# Patient Record
Sex: Female | Born: 1935 | Race: Black or African American | Hispanic: No | Smoking: Current every day smoker
Health system: Southern US, Community
[De-identification: ages and names within clinical notes are randomized; demographics above are authoritative.]

## PROBLEM LIST (undated history)

## (undated) DIAGNOSIS — F329 Major depressive disorder, single episode, unspecified: Secondary | ICD-10-CM

## (undated) DIAGNOSIS — N189 Chronic kidney disease, unspecified: Secondary | ICD-10-CM

## (undated) DIAGNOSIS — J45909 Unspecified asthma, uncomplicated: Secondary | ICD-10-CM

## (undated) DIAGNOSIS — I739 Peripheral vascular disease, unspecified: Secondary | ICD-10-CM

## (undated) DIAGNOSIS — I10 Essential (primary) hypertension: Secondary | ICD-10-CM

## (undated) DIAGNOSIS — F32A Depression, unspecified: Secondary | ICD-10-CM

## (undated) DIAGNOSIS — M1990 Unspecified osteoarthritis, unspecified site: Secondary | ICD-10-CM

## (undated) HISTORY — DX: Essential (primary) hypertension: I10

## (undated) HISTORY — DX: Depression, unspecified: F32.A

## (undated) HISTORY — PX: ABDOMINAL HYSTERECTOMY: SHX81

## (undated) HISTORY — PX: PARATHYROIDECTOMY: SHX19

## (undated) HISTORY — PX: THYROID SURGERY: SHX805

## (undated) HISTORY — DX: Major depressive disorder, single episode, unspecified: F32.9

## (undated) HISTORY — DX: Unspecified osteoarthritis, unspecified site: M19.90

---

## 2004-08-22 ENCOUNTER — Ambulatory Visit: Payer: Self-pay | Admitting: Unknown Physician Specialty

## 2004-09-20 ENCOUNTER — Ambulatory Visit: Payer: Self-pay | Admitting: Unknown Physician Specialty

## 2004-12-14 ENCOUNTER — Ambulatory Visit: Payer: Self-pay | Admitting: General Surgery

## 2005-02-14 ENCOUNTER — Ambulatory Visit (HOSPITAL_COMMUNITY): Admission: RE | Admit: 2005-02-14 | Discharge: 2005-02-14 | Payer: Self-pay | Admitting: Neurological Surgery

## 2005-04-16 ENCOUNTER — Emergency Department: Payer: Self-pay | Admitting: Unknown Physician Specialty

## 2005-04-19 ENCOUNTER — Ambulatory Visit: Payer: Self-pay | Admitting: Unknown Physician Specialty

## 2005-05-09 ENCOUNTER — Ambulatory Visit: Payer: Self-pay | Admitting: Vascular Surgery

## 2006-06-19 ENCOUNTER — Emergency Department: Payer: Self-pay | Admitting: Emergency Medicine

## 2007-10-01 ENCOUNTER — Emergency Department: Payer: Self-pay | Admitting: Emergency Medicine

## 2007-11-21 ENCOUNTER — Ambulatory Visit: Payer: Self-pay | Admitting: Specialist

## 2010-06-03 ENCOUNTER — Ambulatory Visit: Payer: Self-pay | Admitting: Specialist

## 2011-01-17 ENCOUNTER — Ambulatory Visit: Payer: Self-pay | Admitting: Specialist

## 2013-08-19 ENCOUNTER — Ambulatory Visit: Payer: Self-pay | Admitting: Unknown Physician Specialty

## 2014-07-07 ENCOUNTER — Ambulatory Visit: Payer: Self-pay | Admitting: Neurosurgery

## 2014-12-10 ENCOUNTER — Encounter: Payer: Self-pay | Admitting: Podiatry

## 2014-12-10 ENCOUNTER — Ambulatory Visit (INDEPENDENT_AMBULATORY_CARE_PROVIDER_SITE_OTHER): Payer: Medicare Other | Admitting: Podiatry

## 2014-12-10 ENCOUNTER — Ambulatory Visit (INDEPENDENT_AMBULATORY_CARE_PROVIDER_SITE_OTHER): Payer: Medicare Other

## 2014-12-10 VITALS — BP 148/77 | HR 69 | Resp 12

## 2014-12-10 DIAGNOSIS — R52 Pain, unspecified: Secondary | ICD-10-CM

## 2014-12-10 DIAGNOSIS — L97521 Non-pressure chronic ulcer of other part of left foot limited to breakdown of skin: Secondary | ICD-10-CM

## 2014-12-10 DIAGNOSIS — T560X1A Toxic effect of lead and its compounds, accidental (unintentional), initial encounter: Secondary | ICD-10-CM

## 2014-12-10 DIAGNOSIS — M10172 Lead-induced gout, left ankle and foot: Secondary | ICD-10-CM

## 2014-12-10 DIAGNOSIS — R609 Edema, unspecified: Secondary | ICD-10-CM

## 2014-12-10 NOTE — Progress Notes (Signed)
   Subjective:    Patient ID: Nancy Larsen, female    DOB: 1934/08/25, 81 y.o.   MRN: [id number]  HPI  81 year old female presents the office with complaints of left foot pain which has been ongoing for approximately 2 weeks. She states that she believes that she has gout her left foot. She does that she has a history of gout her right foot and this feels like it is the same pain. She states that particular she has pain to her left fourth toe. She denies any history of injury or trauma. She states that she has tenderness and swelling to the left foot as well as pain. He denies any redness overlying the area or any increase in warmth. No recent treatment. She does that she has a history of vascular disease and she sees a Hydrographic surveyor at [facility]. She states that she has not undergone intervention and she has not wanted to undergo the treatment. She has a states that her left calf is been swelling and she periodically has pain. No other complaints at this time.   Review of Systems  All other systems reviewed and are negative.      Objective:   Physical Exam AAO 3, NAD DP/PT pulses palpable decreased bilaterally. CRT less than 3 seconds Protective sensation decreased with Simms Weinstein monofilament, decreased vibratory sensation, Achilles tendon reflex intact. There is mild edema overlying the left forefoot compared to the contralateral extremity. In particular the left fourth digit is edematous and there is evidence of epidermolysis along the dorsal aspect of the digit. Upon debridement dorsal acid of the digit of the fourth toe there is a small superficial granular wound measuring 0.3 x 0.3 cm. There is no surrounding erythema, ascending cellulitis, increase in warmth, fluctuance, crepitus, drainage/purulence, malodor. There is no probing, undermining, tunneling. There is tenderness overlying this wound. There is also tenderness overlying the medial aspect of the first metatarsal head and  there is a moderate bunion deformity present. There is no overlying edema, erythema, increase in warmth. There is no other areas of tenderness to bilateral lower extremities. There is a scar from prior surgery applying the fifth metatarsal. The fifth metatarsal is sitting in a dorsal medial position overlapping the fourth. No other open lesions or pre-ulcerative lesions identified bilaterally. There is mild tenderness with compression of the left calf. There is no significant swelling compared to the contralateral extremity and there is no erythema or increase in warmth.    Assessment & Plan:  81 year old female with left foot pain, gout versus infection; left calf swelling -X-rays were obtained and reviewed -Treatment options discussed with the patient clear alternatives, risks, complications -At this time discussed various etiology of her symptoms. Due to the discomfort patient was placed into a surgical shoe to help offload the forefoot. Antibiotic ointment and a Band-Aid was applied overlying the wound to the left fourth toe. Recommended patient to continue with daily dressing changes. Monitor for any signs or symptoms of infection and directed to call the office and medially should any occur or go to the ER. -Ordered CBC, ESR, CRP, uric acid. We'll start medication after results of the blood work. At this time there is no overt signs of infection other than swelling. -Ordered venous duplex to rule out DVT.  -Follow-up with 2 weeks or sooner if any problems are to arise. In the meantime encouraged to call the office with any questions, concerns, change in symptoms.

## 2014-12-10 NOTE — Patient Instructions (Signed)
Continue antibiotic ointment and a band-aid to the right 4th toe daily. Monitor for any signs/symptoms of infection. Call the office immediately if any occur or go directly to the emergency room. Call with any questions/concerns.

## 2014-12-11 ENCOUNTER — Other Ambulatory Visit: Payer: Self-pay | Admitting: Podiatry

## 2014-12-11 ENCOUNTER — Telehealth: Payer: Self-pay

## 2014-12-11 LAB — CBC WITH DIFFERENTIAL/PLATELET
BASOS ABS: 0 10*3/uL (ref 0.0–0.2)
BASOS: 0 %
EOS: 3 %
Eosinophils Absolute: 0.1 10*3/uL (ref 0.0–0.4)
HCT: 34.7 % (ref 34.0–46.6)
HEMOGLOBIN: 11 g/dL — AB (ref 11.1–15.9)
Immature Grans (Abs): 0 10*3/uL (ref 0.0–0.1)
Immature Granulocytes: 0 %
LYMPHS: 28 %
Lymphocytes Absolute: 1.3 10*3/uL (ref 0.7–3.1)
MCH: 30.4 pg (ref 26.6–33.0)
MCHC: 31.7 g/dL (ref 31.5–35.7)
MCV: 96 fL (ref 79–97)
MONOCYTES: 9 %
Monocytes Absolute: 0.4 10*3/uL (ref 0.1–0.9)
NEUTROS ABS: 2.8 10*3/uL (ref 1.4–7.0)
Neutrophils Relative %: 60 %
PLATELETS: 186 10*3/uL (ref 150–379)
RBC: 3.62 x10E6/uL — AB (ref 3.77–5.28)
RDW: 13 % (ref 12.3–15.4)
WBC: 4.7 10*3/uL (ref 3.4–10.8)

## 2014-12-11 LAB — SEDIMENTATION RATE: SED RATE: 32 mm/h (ref 0–40)

## 2014-12-11 LAB — C-REACTIVE PROTEIN: CRP: 16.5 mg/L — ABNORMAL HIGH (ref 0.0–4.9)

## 2014-12-11 LAB — URIC ACID: Uric Acid: 8.7 mg/dL — ABNORMAL HIGH (ref 2.5–7.1)

## 2014-12-11 MED ORDER — COLCHICINE 0.6 MG PO TABS: 0.6000 mg | ORAL_TABLET | Freq: Every day | ORAL | Status: DC

## 2014-12-11 NOTE — Addendum Note (Signed)
Addended by: Shelly BombardPOLK, Makena Murdock M on: 12/11/2014 08:17 AM   Modules accepted: Orders

## 2014-12-11 NOTE — Addendum Note (Signed)
Addended by: Shelly BombardPOLK, Nahima Ales M on: 12/11/2014 04:50 PM   Modules accepted: Orders

## 2014-12-12 NOTE — Telephone Encounter (Signed)
I called the patient in regards to her blood work. The blood work did reveal an elevated uric acid. Because of this I have sent colchicine to the patients pharmacy and a called to discuss this with her. I discussed side effects the medication and she has any issues to call the office. She states that she has doing better and the pain at night has improved. Also she has requested to have her venous duplex [facility]. Order had been canceled for [facility] and the staff called [facility] yesterday. [facility] should call the patient to schedule the study. Discussed that if she has not heard from them on Monday to call the office Monday afternoon. In the meantime encouraged to call the office with any questions, concerns, change in symptoms.

## 2014-12-15 ENCOUNTER — Telehealth (HOSPITAL_COMMUNITY): Payer: Self-pay | Admitting: *Deleted

## 2014-12-22 ENCOUNTER — Other Ambulatory Visit: Payer: Self-pay | Admitting: *Deleted

## 2014-12-22 ENCOUNTER — Ambulatory Visit (HOSPITAL_COMMUNITY)
Admission: RE | Admit: 2014-12-22 | Discharge: 2014-12-22 | Disposition: A | Discharge status: Home / Self Care | Payer: Medicare Other | Source: Ambulatory Visit | Attending: Podiatry | Admitting: Podiatry

## 2014-12-22 DIAGNOSIS — M10172 Lead-induced gout, left ankle and foot: Secondary | ICD-10-CM | POA: Insufficient documentation

## 2014-12-22 DIAGNOSIS — R609 Edema, unspecified: Secondary | ICD-10-CM

## 2014-12-22 DIAGNOSIS — T560X1A Toxic effect of lead and its compounds, accidental (unintentional), initial encounter: Secondary | ICD-10-CM | POA: Diagnosis not present

## 2014-12-22 DIAGNOSIS — R52 Pain, unspecified: Secondary | ICD-10-CM

## 2014-12-22 DIAGNOSIS — M79672 Pain in left foot: Secondary | ICD-10-CM | POA: Insufficient documentation

## 2014-12-22 DIAGNOSIS — L97521 Non-pressure chronic ulcer of other part of left foot limited to breakdown of skin: Secondary | ICD-10-CM | POA: Diagnosis not present

## 2014-12-22 NOTE — Progress Notes (Signed)
Lower Extremity Venous Duplex Completed. No evidence for DVT, SVT, or venous insufficiency. Suggest possible lower extremity arterial study based on patients complaints of leg cramping. Brianna L Mazza,RVT

## 2014-12-24 ENCOUNTER — Ambulatory Visit (INDEPENDENT_AMBULATORY_CARE_PROVIDER_SITE_OTHER): Payer: Medicare Other | Admitting: Podiatry

## 2014-12-24 DIAGNOSIS — R0989 Other specified symptoms and signs involving the circulatory and respiratory systems: Secondary | ICD-10-CM | POA: Diagnosis not present

## 2014-12-24 DIAGNOSIS — R252 Cramp and spasm: Secondary | ICD-10-CM | POA: Diagnosis not present

## 2014-12-24 DIAGNOSIS — M10072 Idiopathic gout, left ankle and foot: Secondary | ICD-10-CM

## 2014-12-24 DIAGNOSIS — M109 Gout, unspecified: Secondary | ICD-10-CM

## 2014-12-27 NOTE — Progress Notes (Signed)
Patient ID: Nancy Larsen, female   DOB: [birth year], 81 y.o.   MRN: [id number]  Subjective: 81 year old female presents the office they for follow-up evaluation of left foot pain, gout. Since last appointment she was started on colchicine due to increase in uric acid. She states that since starting the medication she's had reduction in pain and swelling. She does continue has some mild intermittent pain however she does is significantly improved. She also states that the wound on the left fourth toe is healed. She denies any increase in redness overlying the foot. Denies any systemic complaints such as fevers, chills, nausea, vomiting. She does continue to get cramping in her calf. No other complaints at this time.  Objective: AAO 3, NAD DP/PT pulses palpable although decreased, CRT less than 3 seconds Protective sensation decreased with Simms Weinstein monofilament, decreased vibratory sensation, Achilles tendon reflex intact. Currently there are no open lesion identified on the left fourth toe or to other areas of bilateral lower extremities. There is mild edema to the left foot however it does appear to be significantly improved compared to last appointment. There is no specific area pinpoint bony tenderness or pain the vibratory sensation to bilateral lower extremities. There is no overlying erythema or increase in warmth bilaterally. No pain with calf compression, swelling, warmth, erythema.  Assessment: 81 year old female with resolving gout left foot, cramping  Plan: -Treatment options were discussed with the patient including alternatives, risks, complications. -Venous duplex was negative for DVT however they've recommended arterial studies to the cramping. Arterial Dopplers ordered. -Finish 2 week course of colchicine. She should finish that in 2 days. After that recommended to hold off on colchicine and less symptoms persist then continue medication until symptoms resolve. If symptoms  are not resolving within 5-7 days to call the office for follow-up appointment. -Follow-up after arterial studies are performed or sooner if any problems are to arise. In the meantime encouraged to call the office with any questions, concerns, change in symptoms.

## 2014-12-30 ENCOUNTER — Telehealth (HOSPITAL_COMMUNITY): Payer: Self-pay | Admitting: *Deleted

## 2015-01-05 ENCOUNTER — Telehealth (HOSPITAL_COMMUNITY): Payer: Self-pay | Admitting: *Deleted

## 2015-01-11 ENCOUNTER — Telehealth (HOSPITAL_COMMUNITY): Payer: Self-pay | Admitting: *Deleted

## 2015-01-14 ENCOUNTER — Telehealth: Payer: Self-pay | Admitting: *Deleted

## 2015-01-14 NOTE — Telephone Encounter (Signed)
I attempted to call patient as well as children in regards to scheduling appointment for doppler study.  Patient did not answer nor was there a voicemail.  Other phone numbers were not in service.  [facility] has made several attempts to contact patient and schedule an appointment without success.

## 2015-01-14 NOTE — Telephone Encounter (Signed)
-----   Message from Vivi BarrackMatthew R Wagoner, DPM sent at 12/27/2014  5:45 PM EDT ----- I ordered arterial studies for this patient and I put the order in Epic. It [facility] but Augusto GambleJody said she didn't know how to do it as the patient wanted to go to Dr. Hazle CocaBerry's office to have it done. If you could please follow up on it, I would appreciate it. Thanks!

## 2015-03-05 ENCOUNTER — Other Ambulatory Visit: Payer: Self-pay | Admitting: Family Medicine

## 2015-03-05 DIAGNOSIS — Z780 Asymptomatic menopausal state: Secondary | ICD-10-CM

## 2015-03-31 ENCOUNTER — Other Ambulatory Visit: Payer: Self-pay | Admitting: Unknown Physician Specialty

## 2015-03-31 DIAGNOSIS — M25552 Pain in left hip: Secondary | ICD-10-CM

## 2015-04-08 ENCOUNTER — Ambulatory Visit
Admission: RE | Admit: 2015-04-08 | Discharge: 2015-04-08 | Disposition: A | Discharge status: Home / Self Care | Payer: Medicare Other | Source: Ambulatory Visit | Attending: Unknown Physician Specialty | Admitting: Unknown Physician Specialty

## 2015-04-08 DIAGNOSIS — S76012A Strain of muscle, fascia and tendon of left hip, initial encounter: Secondary | ICD-10-CM | POA: Insufficient documentation

## 2015-04-08 DIAGNOSIS — M25552 Pain in left hip: Secondary | ICD-10-CM | POA: Insufficient documentation

## 2015-04-08 DIAGNOSIS — X58XXXA Exposure to other specified factors, initial encounter: Secondary | ICD-10-CM | POA: Diagnosis not present

## 2015-06-16 ENCOUNTER — Encounter: Payer: Self-pay | Admitting: Physical Therapy

## 2015-06-16 ENCOUNTER — Other Ambulatory Visit: Payer: Self-pay | Admitting: Neurology

## 2015-06-16 ENCOUNTER — Ambulatory Visit: Payer: Medicare Other | Attending: Neurology | Admitting: Physical Therapy

## 2015-06-16 DIAGNOSIS — R29898 Other symptoms and signs involving the musculoskeletal system: Secondary | ICD-10-CM | POA: Diagnosis present

## 2015-06-16 DIAGNOSIS — R42 Dizziness and giddiness: Secondary | ICD-10-CM

## 2015-06-16 DIAGNOSIS — R269 Unspecified abnormalities of gait and mobility: Secondary | ICD-10-CM | POA: Diagnosis present

## 2015-06-17 DIAGNOSIS — R42 Dizziness and giddiness: Secondary | ICD-10-CM | POA: Diagnosis not present

## 2015-06-17 NOTE — Therapy (Signed)
[facility] MAIN [facility] SERVICES [address] [place], [place], [address] Phone: [phone]   Fax:  [phone]  Physical Therapy Evaluation  Patient Details  Name: LALIA LOUDON MRN: [id number] Date of Birth: [birth year] Referring Provider:  Morene Crocker, MD  Encounter Date: 06/16/2015      PT End of Session - 06/17/15 0750    Visit Number 1   Number of Visits 9   Date for PT Re-Evaluation 08/12/15   Authorization Type Gcode 1   Authorization Time Period 10   PT Start Time 1410   PT Stop Time 1515   PT Time Calculation (min) 65 min   Equipment Utilized During Treatment Gait belt   Activity Tolerance Other (comment)  Limited by dizziness   Behavior During Therapy [facility] for tasks assessed/performed      Past Medical History  Diagnosis Date  . Depression   . Hypertension   . Arthritis     History reviewed. No pertinent past surgical history.  There were no vitals filed for this visit.  Visit Diagnosis:  Dizziness and giddiness - Plan: PT plan of care cert/re-cert  Abnormality of gait - Plan: PT plan of care cert/re-cert  Left leg weakness - Plan: PT plan of care cert/re-cert      Subjective Assessment - 06/16/15 1414    Subjective Patient is being seen in PT for increased dizziness and LE weakness. Patient reports that that she usually has "inner ear vertigo" every year that is treated with medication, and BPPV treatment once in the past with good results. She states that the medication has not helped reduce her dizziness this year. Patient states that she noticed an increase in dizziness in march that has only gotten worse. Prior to PT on this day, patient reports that she could hardly get out of bed, due to dizziness, and also felt like her L LE was weaker than usual.    Pertinent History Patient has a history L LE pain in the hip and thae ankle. She states that the sensation in the hip is more of a numb feeling. Also has a  history depression, arthritis and hypertension.    How long can you stand comfortably? "not long"    How long can you walk comfortably? Patient reports that she can go as far as needed with [facility]    Diagnostic tests MRI scheduled for next week.   Patient Stated Goals reduce dizziness, decrease use of cane, improve bed mobility with decreased dizziness.    Currently in Pain? No/denies            [facility] PT Assessment - 06/17/15 0001    Assessment   Medical Diagnosis Dizziness    Onset Date/Surgical Date 06/02/15   Hand Dominance Right   Next MD Visit End of October    Prior Therapy Patient has had PT to reduce back pain 4-5 years ago. She states that she has had treatment for positional vertigo in the past with good results.    Precautions   Precautions Fall   Balance Screen   Has the patient fallen in the past 6 months Yes   How many times? 2  on stairs    Has the patient had a decrease in activity level because of a fear of falling?  Yes   Is the patient reluctant to leave their home because of a fear of falling?  Yes   Prior Function   Level of Independence Independent  Vocation Retired   Leisure Go out with grandchildren to the store    Cognition   Overall Cognitive Status Within Functional Limits for tasks assessed   Observation/Other Assessments   Dizziness Handicap Inventory [facility])  56(moderate perception of handicap)    Sensation   Light Touch Impaired by gross assessment   Additional Comments Mild Decreased sensation along L2 dermatome   Posture/Postural Control   Posture Comments Patient sits and stands with decreased lumbar lordosis, increased thoracic kyphosis and forward head posture.    AROM   Overall AROM Comments Patient demonstrates decreased L LE AROM with hip flexion and hip extension. Decreased L shoulder ROM to approx 110 degrees flexion and abduction secondary arthritis.    PROM   Overall PROM Comments PROM for B LE is WNL. Decreased L shoulder PROM due to  increased pain the GH joint.    Strength   Overall Strength Comments all R LE and UE MMT at least 4+/5. L LE hip abduction, adduction and extension 4-/5. L hip flexion 3/5. L LE knee flexion and extension 4-/.5 L UE shoulder flexion and abduction 4/5 secondary to pain and elbow flexion and extension 4/5 secondary to shoulder pain.     Palpation   Palpation comment Slight tenderness noted in upper traps and cervical paraspinal muscles    Transfers   Comments Patient required the use of BUE for all transfers.    Ambulation/Gait   Gait Comments Patient ambulated with SPC in the R hand. Decreased stance time, foot clearance and step length noted on the L LE.    Standardized Balance Assessment   Five times sit to stand comments  20 seconds with BUE use (>15 seconds without UE use indicates increased fall risk)    10 Meter Walk 0.22m/s with SPC. ( <1.0 m/s indicates decreased community ambulation and increased fall risk)          VOR  1 and 2 tested: patient reports dizziness and double vision with horizontal head movements and increased dizziness with vertical head movements.   Treatment:   Halpike-Dix for R and L Posterior canal BPPV. Patient demonstrated positive R sided BPPV without nystagmus, but reported increased dizziness and demonstrated emesis.  Canalith repositioning maneuver performed x2. Patient reported decreased dizziness following treatment    HEP education. Horizontal VOR x10  Vertical VOR x10   Patient instructed to decrease speed of movement to decrease unstable or double vision. Patient responded will to instruction.                    PT Education - 06/17/15 0751    Education provided Yes   Education Details BPPV education, HEP - see patient instructions, plan of care    Person(s) Educated Patient   Methods Explanation;Demonstration;Tactile cues;Verbal cues   Comprehension Verbalized understanding;Returned demonstration;Verbal cues required              PT Long Term Goals - 06/17/15 0927    PT LONG TERM GOAL #1   Title Patient will be independent with HEP to increase balance, improve gait, decrease dizziness and improve LE strength to allow improved function with ADLs by 08/12/15   Time 8   Period Weeks   Status New   PT LONG TERM GOAL #2   Title Patient will decrease DHI to <30 to indicate decreased percieved handicap due to dizziness and improve function within the community by 08/12/15   Time 8   Period Weeks   Status New  PT LONG TERM GOAL #3   Title Patient will improve gait speed to >1.0 m/s with SPC to indicate improve balance and increased safety with community ambulation by 08/12/15   Time 8   Period Weeks   Status New   PT LONG TERM GOAL #4   Title Patient will score >46 on the Berg balance scale to indicate decreased fall risk and improved function with ADLs by 08/12/15.    PT LONG TERM GOAL #5   Title Patient will score >19/24 on the DGI to indicate improve function and decreased fall risk with community ambulation by 08/12/15.    Time 8   Period Weeks   Status New               Plan - June 19, 2015 0752    Clinical Impression Statement This patient is a pleasant 81 year old female that reports to PT due to increased dizziness and L LE weakness. Through PT assessment patient demonstrated significant decrease in L and UE strength compared to the R. Patient also demonstrates decreased balance, evidenced through decreased scores on the 11m walk test and 5 times sit to stand. Patient tests positive for R posterior canal sided BPPV through Halpike-Dix with significant increase in dizziness and emesis, but no nystagmus noted. PT performed canalith reposition maneuver to treat positive Halpike-Dix two times. Patient reports decreased dizziness following treatment. PT also instruction patient in VOR 1 and 2 training, slight increase dizziness in both directions. Based on impairments found at time of PT evaluation,  this patient would benefit from skilled PT to reduce dizziness, increase LE strength and increase balance to improve function at home and in the community and reduce fall risk.   Pt will benefit from skilled therapeutic intervention in order to improve on the following deficits Abnormal gait;Decreased activity tolerance;Decreased balance;Decreased endurance;Decreased strength;Decreased mobility;Decreased range of motion;Decreased safety awareness;Dizziness;Impaired perceived functional ability;Impaired sensation;Improper body mechanics;Postural dysfunction   Rehab Potential Good   Clinical Impairments Affecting Rehab Potential Positive good response to BPPV treatment in the past. Negative: chronic condition, co-morbidities.    PT Frequency 1x / week   PT Duration 8 weeks   PT Treatment/Interventions ADLs/Self Care Home Management;Canalith Repostioning;Moist Heat;Cryotherapy;Gait training;DME Instruction;Stair training;Functional mobility training;Therapeutic activities;Therapeutic exercise;Balance training;Neuromuscular re-education;Patient/family education;Manual techniques;Vestibular   PT Next Visit Plan Increase vestibular exercises. start balance exercise. BPPV treatment.    PT Home Exercise Plan VOR 1 and 2 training. handout provided    Consulted and Agree with Plan of Care Patient          G-Codes - 06-19-15 1443    Functional Assessment Tool Used dizziness handicap index, clinical judgement;   Functional Limitation Changing and maintaining body position   Changing and Maintaining Body Position Current Status [phone]) At least 60 percent but less than 80 percent impaired, limited or restricted   Changing and Maintaining Body Position Goal Status (U0454) At least 20 percent but less than 40 percent impaired, limited or restricted       Problem List There are no active problems to display for this patient.  Grier Rocher SPT 06-19-2015   3:16 PM  This entire session was performed  under direct supervision and direction of a licensed therapist . I have personally read, edited and approve of the note as written.   Hopkins,Margaret PT, DPT 2015-06-19, 3:16 PM  [facility] MAIN [facility] SERVICES [address] [place], [place], [address] Phone: [phone]   Fax:  [phone]

## 2015-06-21 ENCOUNTER — Ambulatory Visit: Payer: Medicare Other | Admitting: Physical Therapy

## 2015-06-23 ENCOUNTER — Ambulatory Visit: Payer: Medicare Other | Admitting: Physical Therapy

## 2015-06-23 ENCOUNTER — Encounter: Payer: Self-pay | Admitting: Physical Therapy

## 2015-06-23 DIAGNOSIS — R42 Dizziness and giddiness: Secondary | ICD-10-CM

## 2015-06-23 DIAGNOSIS — R269 Unspecified abnormalities of gait and mobility: Secondary | ICD-10-CM

## 2015-06-23 DIAGNOSIS — R29898 Other symptoms and signs involving the musculoskeletal system: Secondary | ICD-10-CM

## 2015-06-23 NOTE — Patient Instructions (Signed)
SIT TO STAND: No Device   Sit with feet shoulder-width apart, on floor.(Make sure that you are in a chair that won't move like a chair against a wall or couch etc) Lean chest forward, raise hips up from surface. Straighten hips and knees. Weight bear equally on left and right sides. 10___ reps per set, _2__ sets per day, _5__ days per week Place left leg closer to sitting surface.  Copyright  VHI. All rights reserved.  Backward Walking  Copyright  VHI. All rights reserved.  Tandem stance    Stand beside kitchen sink and place one foot in front of the other, lift your hand and try to hold position for 10 sec. Repeat with other foot in front; Repeat 3 reps with each foot in front 5 days a week.    http://orth.exer.us/29   Copyright  VHI. All rights reserved.

## 2015-06-23 NOTE — Therapy (Signed)
[facility] [facility] MAIN [facility] SERVICES [address] [place], [place], [address] Phone: [phone]   Fax:  [phone]  Physical Therapy Treatment  Patient Details  Name: Nancy Larsen MRN: [id number] Date of Birth: Oct 12, 1933 Referring Provider:  Morene Crocker, MD  Encounter Date: 06/23/2015    Past Medical History  Diagnosis Date  . Depression   . Hypertension   . Arthritis     History reviewed. No pertinent past surgical history.  There were no vitals filed for this visit.  Visit Diagnosis:  Dizziness and giddiness  Abnormality of gait  Left leg weakness      Subjective Assessment - 06/23/15 1313    Subjective Patient reports to PT stating that she is dizzer than ever. She reports that she did not sleep well last night, so she was rolling all over the bed. Causing her to be very dizzy.   Pertinent History Patient has a history L LE pain in the hip and thae ankle. She states that the sensation in the hip is more of a numb feeling. Also has a history depression, arthritis and hypertension.    Limitations Standing   How long can you stand comfortably? "not long"    How long can you walk comfortably? Patient reports that she can go as far as needed with [facility]    Diagnostic tests MRI scheduled for next week.   Patient Stated Goals reduce dizziness, decrease use of cane, improve bed mobility with decreased dizziness.    Currently in Pain? No/denies           Treatment:   PT performed Halpike-Dix test for R and L posterior canal BPPV. Patient reports increased dizziness with L and R test, without nystagmus. PT performed R and L canalith repositioning maneuver x 2 each side. Patient reports significant decrease in dizziness following treatment.    Seated horizontal VOR training 3x30 seconds  Seated vertical VOR training 3x30 seconds  Standing  Horizontal VOR training 2x30 seconds  Standing vertical VOR training 2x30 seconds    Patient reports increased dizziness with standing horizontal VOR movements. PT provided min verbal instruction to increased gaze stabilization and decreased speed of movement with head turns/nods. Patient responded well to instruction from PT.   Balance training.   Patient  Instructed in [place] balance scale. See above for results   SLS, BLE  2x30 seconds  HEP education Tandem stand 3x20 seconds each LE  Sit to stand 2x10    PT provided moderate verbal instruction for increased use of hip strategy, increased gaze stabilization, and proper use of UE. Patient responded very well to instruction.                      PT Education - 06/23/15 1609    Education provided Yes   Education Details Canalith repositioning, balance training and HEP - see patient instructions    Person(s) Educated Patient   Methods Explanation;Demonstration;Verbal cues   Comprehension Verbalized understanding;Returned demonstration;Verbal cues required             PT Long Term Goals - 06/24/15 1631    PT LONG TERM GOAL #1   Title Patient will be independent with HEP to increase balance, improve gait, decrease dizziness and improve LE strength to allow improved function with ADLs by 08/12/15   Time 8   Period Weeks   Status New   PT LONG TERM GOAL #2   Title Patient will decrease DHI to <  30 to indicate decreased percieved handicap due to dizziness and improve function within the community by 08/12/15   Time 8   Period Weeks   Status New   PT LONG TERM GOAL #3   Title Patient will improve gait speed to >1.0 m/s with SPC to indicate improve balance and increased safety with community ambulation by 08/12/15   Time 8   Period Weeks   Status New   PT LONG TERM GOAL #4   Title Patient will score >46 on the Berg balance scale to indicate decreased fall risk and improved function with ADLs by 08/12/15.    Time 8   Period Weeks   Status New   PT LONG TERM GOAL #5   Title Patient will score  >19/24 on the DGI to indicate improve function and decreased fall risk with community ambulation by 08/12/15.    Time 8   Period Weeks   Status New               Plan - 06/23/15 1610    Clinical Impression Statement Patient instructed in canalith repositioning as well as seated VOR training. Patient reports slight dizziness with R and L Halpike-Dix test. PT performed canalith reposition for R and L posterior canal. Patient reports significant decrease in dizziness following BPPV treatment. Patient also instructed in Berg Balance Scale and scored in the high fall risk category. Balance HEP initiated; moderate verbal instruction provided by PT for proper exercise set up and use of UE. Continued skilled PT is recommended to decrease dizziness, increase LE strength, improve balance, and decrease fall risk to allow greater independence at home.   Pt will benefit from skilled therapeutic intervention in order to improve on the following deficits Abnormal gait;Decreased activity tolerance;Decreased balance;Decreased endurance;Decreased strength;Decreased mobility;Decreased range of motion;Decreased safety awareness;Dizziness;Impaired perceived functional ability;Impaired sensation;Improper body mechanics;Postural dysfunction   Rehab Potential Good   Clinical Impairments Affecting Rehab Potential Positive good response to BPPV treatment in the past. Negative: chronic condition, co-morbidities.    PT Frequency 1x / week   PT Duration 6 weeks   PT Treatment/Interventions ADLs/Self Care Home Management;Canalith Repostioning;Moist Heat;Cryotherapy;Gait training;DME Instruction;Stair training;Functional mobility training;Therapeutic activities;Therapeutic exercise;Balance training;Neuromuscular re-education;Patient/family education;Manual techniques;Vestibular   PT Next Visit Plan Increase vestibular exercises. start balance exercise. BPPV treatment.    PT Home Exercise Plan balance HEP - see patient  instructions    Consulted and Agree with Plan of Care Patient        Problem List There are no active problems to display for this patient.  Grier Rocher SPT 06/24/2015   4:31 PM  This entire session was performed under direct supervision and direction of a licensed therapist . I have personally read, edited and approve of the note as written.  Hopkins,Margaret PT, DPT 06/24/2015, 4:31 PM  [place] [facility] MAIN [facility] SERVICES [address] [place], [place], [address] Phone: [phone]   Fax:  [phone]

## 2015-06-28 ENCOUNTER — Ambulatory Visit: Payer: Medicare Other | Admitting: Physical Therapy

## 2015-07-01 ENCOUNTER — Ambulatory Visit: Payer: Medicare Other | Admitting: Physical Therapy

## 2015-07-05 ENCOUNTER — Ambulatory Visit: Payer: Medicare Other | Admitting: Physical Therapy

## 2015-07-07 ENCOUNTER — Ambulatory Visit: Payer: Medicare Other | Attending: Neurology | Admitting: Physical Therapy

## 2015-07-14 ENCOUNTER — Ambulatory Visit: Payer: Medicare Other | Admitting: Physical Therapy

## 2015-07-15 ENCOUNTER — Ambulatory Visit
Admission: RE | Admit: 2015-07-15 | Discharge: 2015-07-15 | Disposition: A | Discharge status: Home / Self Care | Payer: Medicare Other | Source: Ambulatory Visit | Attending: Neurology | Admitting: Neurology

## 2015-07-15 DIAGNOSIS — G319 Degenerative disease of nervous system, unspecified: Secondary | ICD-10-CM | POA: Insufficient documentation

## 2015-07-15 DIAGNOSIS — R42 Dizziness and giddiness: Secondary | ICD-10-CM | POA: Diagnosis present

## 2015-07-15 DIAGNOSIS — M4802 Spinal stenosis, cervical region: Secondary | ICD-10-CM | POA: Diagnosis not present

## 2015-07-15 DIAGNOSIS — I739 Peripheral vascular disease, unspecified: Secondary | ICD-10-CM | POA: Diagnosis not present

## 2015-07-21 ENCOUNTER — Ambulatory Visit: Payer: Medicare Other | Admitting: Physical Therapy

## 2015-07-28 ENCOUNTER — Ambulatory Visit: Payer: Medicare Other | Admitting: Physical Therapy

## 2015-10-01 ENCOUNTER — Ambulatory Visit: Payer: Medicare Other | Admitting: Sports Medicine

## 2015-10-07 ENCOUNTER — Encounter: Payer: Self-pay | Admitting: Podiatry

## 2015-10-07 ENCOUNTER — Ambulatory Visit (INDEPENDENT_AMBULATORY_CARE_PROVIDER_SITE_OTHER): Payer: Medicare HMO

## 2015-10-07 ENCOUNTER — Ambulatory Visit (INDEPENDENT_AMBULATORY_CARE_PROVIDER_SITE_OTHER): Payer: Medicare HMO | Admitting: Podiatry

## 2015-10-07 VITALS — BP 120/87 | HR 73 | Resp 18

## 2015-10-07 DIAGNOSIS — M2041 Other hammer toe(s) (acquired), right foot: Secondary | ICD-10-CM

## 2015-10-07 DIAGNOSIS — M205X9 Other deformities of toe(s) (acquired), unspecified foot: Secondary | ICD-10-CM

## 2015-10-07 DIAGNOSIS — M779 Enthesopathy, unspecified: Secondary | ICD-10-CM | POA: Diagnosis not present

## 2015-10-07 DIAGNOSIS — R52 Pain, unspecified: Secondary | ICD-10-CM | POA: Diagnosis not present

## 2015-10-08 NOTE — Progress Notes (Signed)
Patient ID: Nancy Larsen, female   DOB: [birth year], 82 y.o.   MRN: [id number]  Subjective: 82 year old female presents the office today for painful bilateral first big toe joint is started on Saturday with the left side worse than the right. She denies any recent injury or trauma. She states that she's had difficulty wearing shoes. She has had gout before and is worried that this may be the same thing. She states that she's having difficulty with pressure and walking as well. She has a states that her right second toe is painful. No recent injury or trauma. Denies any systemic complaints such as fevers, chills, nausea, vomiting. No acute changes since last appointment, and no other complaints at this time.   Objective: AAO x3, NAD DP/PT pulses palpable bilaterally, CRT less than 3 seconds Protective sensation decreased with Simms Weinstein monofilament There appears to be decreased range of motion of the first MTPJ bilaterally with a left worse than right particularly in dorsiflexion. There is localized edema to bilateral first MTPJ's with a left greater than right. There is no significant erythema or increase in warmth. There is no fluctuance or crepitus. No open lesions. There is no specific pinpoint bony tenderness there is no pain vibratory sensation. The right second toe just in a contracted position there is localized edema to the distal interphalangeal joint dorsally. Slight tenderness overlying the area. No other areas of tenderness bilateral lower extremities No areas of pinpoint bony tenderness or pain with vibratory sensation. MMT 5/5, ROM WNL. No edema, erythema, increase in warmth to bilateral lower extremities.  No open lesions or pre-ulcerative lesions.  No pain with calf compression, swelling, warmth, erythema  Assessment: 82 year old female with capsulitis, hallux limitus bilateral first MTPJ's left greater than the right; right second digit hammertoe  Plan: -All treatment  options discussed with the patient including all alternatives, risks, complications.  -Etiology of symptoms were discussed -X-rays were obtained and reviewed with the patient.  -Discussed steroid injections to bilateral first MTPJ's for which she understands stance the risks and wishes to proceed. Under sterile conditions a mixture of dexamethasone phosphate and 2% lidocaine plain was infiltrated into bilateral first MTPJ's without complications. Post injection care was discussed. -Discussed orthotics and shoe gear modifications. -Offloading pads second toe. -Follow-up next couple weeks or sooner if any problems are to arise. -Patient encouraged to call the office with any questions, concerns, change in symptoms.   Ovid CurdMatthew Wagoner, DPM

## 2015-10-26 ENCOUNTER — Ambulatory Visit (INDEPENDENT_AMBULATORY_CARE_PROVIDER_SITE_OTHER): Payer: Medicare HMO | Admitting: Podiatry

## 2015-10-26 ENCOUNTER — Encounter: Payer: Self-pay | Admitting: Podiatry

## 2015-10-26 DIAGNOSIS — M10072 Idiopathic gout, left ankle and foot: Secondary | ICD-10-CM

## 2015-10-26 DIAGNOSIS — M109 Gout, unspecified: Secondary | ICD-10-CM

## 2015-10-26 DIAGNOSIS — M779 Enthesopathy, unspecified: Secondary | ICD-10-CM

## 2015-10-26 MED ORDER — METHYLPREDNISOLONE 4 MG PO TBPK: ORAL_TABLET | ORAL | Status: DC

## 2015-11-01 NOTE — Progress Notes (Signed)
Patient ID: Nancy Larsen, female   DOB: 1933/11/22, 82 y.o.   MRN: [id number]  Subjective: 82 year old female presents the office today for painful bilateral first big toe joint  With the left worse than the right. She states that the toe is still swollen somewhat red. It has gotten better but it has plateaued. No recent injury or trauma. She has pain returned and move her big toe joint on the left side. No other complaints at this time.  Objective: AAO x3, NAD DP/PT pulses palpable bilaterally, CRT less than 3 seconds Protective sensation decreased with Simms Weinstein monofilament There appears to be decreased range of motion of the first MTPJ bilaterally with a left worse than right particularly in dorsiflexion. There is localized edema to the dorsal aspect of left first MTPJ held there is no swelling to the right side. There is no increase in warmth. This paint erythema over the dorsal left  MPJ. There is crepitation with first MTPJ range of motion. No other areas of tenderness to bilateral lower extremities. No open lesions or pre-ulcer lesions identified at this time. No open lesions or pre-ulcerative lesions.  No pain with calf compression, swelling, warmth, erythema  Assessment: 82 year old female with  Left first MTPJ capsulitis, gout  Plan: -All treatment options discussed with the patient including all alternatives, risks, complications.  -Etiology of symptoms were discussed -She wishes to hold off on any further steroid injection. Prescribed Medrol Dosepak. -Discussed orthotics and shoe gear modifications. -Follow-up as scheduled or sooner if any problems are to arise. -Patient encouraged to call the office with any questions, concerns, change in symptoms.   Ovid Curd, DPM

## 2015-11-11 ENCOUNTER — Encounter: Payer: Self-pay | Admitting: Podiatry

## 2015-11-11 ENCOUNTER — Ambulatory Visit (INDEPENDENT_AMBULATORY_CARE_PROVIDER_SITE_OTHER): Payer: Medicare HMO

## 2015-11-11 ENCOUNTER — Ambulatory Visit (INDEPENDENT_AMBULATORY_CARE_PROVIDER_SITE_OTHER): Payer: Medicare HMO | Admitting: Podiatry

## 2015-11-11 VITALS — BP 140/67 | HR 83 | Resp 18

## 2015-11-11 DIAGNOSIS — R52 Pain, unspecified: Secondary | ICD-10-CM | POA: Diagnosis not present

## 2015-11-11 DIAGNOSIS — M779 Enthesopathy, unspecified: Secondary | ICD-10-CM | POA: Diagnosis not present

## 2015-11-11 DIAGNOSIS — M10072 Idiopathic gout, left ankle and foot: Secondary | ICD-10-CM | POA: Diagnosis not present

## 2015-11-11 DIAGNOSIS — M109 Gout, unspecified: Secondary | ICD-10-CM

## 2015-11-11 MED ORDER — CEPHALEXIN 500 MG PO CAPS: 500.0000 mg | ORAL_CAPSULE | Freq: Three times a day (TID) | ORAL | Status: DC

## 2015-11-11 NOTE — Progress Notes (Signed)
Patient ID: Nancy Larsen, female   DOB: 12-07-1933, 82 y.o.   MRN: [id number]  Subjective: 82 year old female presents the office today for painful bilateral first big toe joint. She states the pain is not any better, and actually getting worse. She states that she is having significant cramping to her calf and foot only at night. She does continue gets some mild swelling on the big toe joint the pain is started to go to her other toes as well as well as the midfoot. She has remained in the surgical shoe which helps. No recent injury or trauma. She has pain returned and move her big toe joint on the left side. No other complaints at this time.  Objective: AAO x3, NAD DP/PT pulses palpable bilaterally, CRT less than 3 seconds Protective sensation decreased with Simms Weinstein monofilament There appears to be decreased range of motion of the first MTPJ bilaterally with a left worse than right particularly in dorsiflexion. There is localized edema to the dorsal aspect of left first MTPJ and there is no swelling to the right side. There is no erythema or increase in warmth to bilateral lower extremes. There is tenderness along the metatarsal heads 2 through 5 on the left side. There is also a dorsal exostosis off the left midfoot which is tender to palpation. No other areas of tenderness to bilateral lower extremities.  No open lesions or pre-ulcerative lesions.  No pain with calf compression, swelling, warmth, erythema  Assessment: 82 year old female with  Left first MTPJ capsulitis, gout  Plan: -All treatment options discussed with the patient including all alternatives, risks, complications.  -X-rays were obtained and reviewed with the patient.  There is some fragmentation on the first MTPJ likely result of arthritis. There is no other definitive evidence of acute fracture at this time. -We'll order blood work including CBC, ESR, CRP, uric acid -Continue surgical shoe - I am unable to see the  results of the fascia states from last year's I will request a copy. Likely vascular follow-up. - I doubt infection however given the continued swelling and pain will start Keflex in case of infection. -Follow-up as scheduled or sooner if any problems are to arise. -Patient encouraged to call the office with any questions, concerns, change in symptoms.   Celesta Gentile, DPM

## 2015-11-12 LAB — CBC WITH DIFFERENTIAL/PLATELET
BASOS: 1 %
Basophils Absolute: 0 10*3/uL (ref 0.0–0.2)
EOS (ABSOLUTE): 0.1 10*3/uL (ref 0.0–0.4)
EOS: 2 %
HEMATOCRIT: 31.8 % — AB (ref 34.0–46.6)
Hemoglobin: 10.6 g/dL — ABNORMAL LOW (ref 11.1–15.9)
IMMATURE GRANS (ABS): 0 10*3/uL (ref 0.0–0.1)
IMMATURE GRANULOCYTES: 0 %
LYMPHS: 29 %
Lymphocytes Absolute: 1.4 10*3/uL (ref 0.7–3.1)
MCH: 31.6 pg (ref 26.6–33.0)
MCHC: 33.3 g/dL (ref 31.5–35.7)
MCV: 95 fL (ref 79–97)
Monocytes Absolute: 0.4 10*3/uL (ref 0.1–0.9)
Monocytes: 7 %
NEUTROS PCT: 61 %
Neutrophils Absolute: 3 10*3/uL (ref 1.4–7.0)
PLATELETS: 199 10*3/uL (ref 150–379)
RBC: 3.35 x10E6/uL — AB (ref 3.77–5.28)
RDW: 13.8 % (ref 12.3–15.4)
WBC: 4.9 10*3/uL (ref 3.4–10.8)

## 2015-11-12 LAB — BASIC METABOLIC PANEL
BUN/Creatinine Ratio: 18 (ref 11–26)
BUN: 19 mg/dL (ref 8–27)
CALCIUM: 9.2 mg/dL (ref 8.7–10.3)
CHLORIDE: 99 mmol/L (ref 96–106)
CO2: 24 mmol/L (ref 18–29)
CREATININE: 1.08 mg/dL — AB (ref 0.57–1.00)
GFR calc Af Amer: 56 mL/min/{1.73_m2} — ABNORMAL LOW (ref >59)
GFR calc non Af Amer: 49 mL/min/{1.73_m2} — ABNORMAL LOW (ref >59)
GLUCOSE: 89 mg/dL (ref 65–99)
Potassium: 4.4 mmol/L (ref 3.5–5.2)
Sodium: 142 mmol/L (ref 134–144)

## 2015-11-12 LAB — SEDIMENTATION RATE: Sed Rate: 54 mm/hr — ABNORMAL HIGH (ref 0–40)

## 2015-11-12 LAB — C-REACTIVE PROTEIN: CRP: 24 mg/L — AB (ref 0.0–4.9)

## 2015-11-12 LAB — URIC ACID: URIC ACID: 8.4 mg/dL — AB (ref 2.5–7.1)

## 2015-11-25 ENCOUNTER — Ambulatory Visit (INDEPENDENT_AMBULATORY_CARE_PROVIDER_SITE_OTHER): Payer: Medicare HMO | Admitting: Podiatry

## 2015-11-25 ENCOUNTER — Encounter: Payer: Self-pay | Admitting: Podiatry

## 2015-11-25 VITALS — BP 156/70 | HR 76 | Resp 18

## 2015-11-25 DIAGNOSIS — M109 Gout, unspecified: Secondary | ICD-10-CM

## 2015-11-25 DIAGNOSIS — M205X9 Other deformities of toe(s) (acquired), unspecified foot: Secondary | ICD-10-CM

## 2015-11-25 DIAGNOSIS — M779 Enthesopathy, unspecified: Secondary | ICD-10-CM

## 2015-11-25 DIAGNOSIS — M10072 Idiopathic gout, left ankle and foot: Secondary | ICD-10-CM

## 2015-11-26 ENCOUNTER — Telehealth: Payer: Self-pay | Admitting: *Deleted

## 2015-11-26 DIAGNOSIS — R0989 Other specified symptoms and signs involving the circulatory and respiratory systems: Secondary | ICD-10-CM

## 2015-11-26 LAB — C-REACTIVE PROTEIN: CRP: 13.1 mg/L — AB (ref 0.0–4.9)

## 2015-11-26 LAB — SEDIMENTATION RATE: Sed Rate: 50 mm/hr — ABNORMAL HIGH (ref 0–40)

## 2015-11-26 MED ORDER — NONFORMULARY OR COMPOUNDED ITEM: Status: DC

## 2015-11-26 NOTE — Telephone Encounter (Addendum)
-----   Message from Vivi Barrack, DPM sent at 11/26/2015  6:39 AM EST ----- Can you please let her know that her inflammation markers are coming down but sill elevated. Can you order a compound cream, the one for antiinflammatory. Also, she needs arterial studies. She was scheduled last year but I think she cancelled as she was improving at that time. Faxed arterial doppler orders to  [facility].  Faxed [facility] antiinflammatory cream orders.

## 2015-11-26 NOTE — Progress Notes (Signed)
Patient ID: Nancy Larsen, female   DOB: [birth year], 82 y.o.   MRN: [id number]  Subjective: 82 year old female presents the office today for pain in her left big toe joint. She states that her right side is doing fine but her left side still is somewhat swollen and painful although she does feel it is better compared to last appointment. She has continued the surgical shoe. Denies any redness or warmth overlying the area. Tenderness become painful still she tries to move it quite a bit. She has been taken and a vice as directed. She denies any systemic complaints as fevers, chills, nausea, vomiting. No calf pain, chest pain, shortness of breath.  No other complaints at this time.  Objective: AAO x3, NAD DP/PT pulses palpable bilaterally, CRT less than 3 seconds Protective sensation decreased with Simms Weinstein monofilament There appears to be decreased range of motion of the first MTPJ bilaterally with a left worse than right particularly in dorsiflexion. There is localized edema to the dorsal aspect of left first MTPJ and there is no swelling to the right side. The edema on the left is improved compared to last appointment.There is no erythema or increase in warmth to bilateral lower extremes. No other areas of tenderness to bilateral lower extremities.  No open lesions or pre-ulcerative lesions.  No pain with calf compression, swelling, warmth, erythema  Assessment: 82 year old female with left first MTPJ capsulitis, gout  Plan: -All treatment options discussed with the patient including all alternatives, risks, complications.  -Discussed previous lumbar. Her ESR insert was quite elevated. Because this I will repeat this today. I think continuing the surgical shoe for now. Finish the course of antibiotics. Pending lab results will likely start an anti-inflammatory-type medication. -If there is still pain is appointment likely aspect of the first MPJ. -Follow up as directed or sooner if any  issues are to arise. Call if questions concerns.  Celesta Gentile, DPM

## 2015-12-23 ENCOUNTER — Ambulatory Visit: Payer: Medicare HMO | Admitting: Podiatry

## 2015-12-24 ENCOUNTER — Encounter: Payer: Self-pay | Admitting: Podiatry

## 2015-12-27 ENCOUNTER — Telehealth: Payer: Self-pay | Admitting: *Deleted

## 2015-12-27 DIAGNOSIS — R0989 Other specified symptoms and signs involving the circulatory and respiratory systems: Secondary | ICD-10-CM

## 2015-12-27 NOTE — Telephone Encounter (Addendum)
Informed pt the 12/24/2015 dopplers were abnormal and Dr. Ardelle AntonWagoner is referring her to [facility] for consultation.  I gave pt their appt line. Faxed referral to [facility].

## 2016-01-04 ENCOUNTER — Encounter: Payer: Self-pay | Admitting: Podiatry

## 2016-01-04 ENCOUNTER — Ambulatory Visit (INDEPENDENT_AMBULATORY_CARE_PROVIDER_SITE_OTHER): Payer: Medicare HMO | Admitting: Podiatry

## 2016-01-04 VITALS — BP 107/80 | HR 65 | Resp 18

## 2016-01-04 DIAGNOSIS — B351 Tinea unguium: Secondary | ICD-10-CM

## 2016-01-04 DIAGNOSIS — I739 Peripheral vascular disease, unspecified: Secondary | ICD-10-CM | POA: Diagnosis not present

## 2016-01-04 DIAGNOSIS — M79676 Pain in unspecified toe(s): Secondary | ICD-10-CM

## 2016-01-04 DIAGNOSIS — M779 Enthesopathy, unspecified: Secondary | ICD-10-CM | POA: Diagnosis not present

## 2016-01-04 NOTE — Progress Notes (Signed)
Patient ID: Nancy Larsen, female   DOB: Apr 27, 1934, 82 y.o.   MRN: [id number]  Subjective: 82 year old female presents the office today for follow-up evaluation of left big toe joint swelling and pain. She states that she cannot wear regular shoe and the areas much improved. The swelling has decreased. She has followed up with Dr. Wyn Quakerew and she is scheduled for an angio in 2 weeks. She says her nails are thick and elongated she cannot trim them herself and pressure particular shoe gear. No surrounding redness or drainage. Denies any systemic complaints such as fevers, chills, nausea, vomiting. No acute changes since last appointment, and no other complaints at this time.   Objective: AAO x3, NAD DP/PT pulses palpable bilaterally but decreased, CRT less than 3 seconds Protective sensation decreased with Simms Weinstein monofilament There is minimal edema to the left first MTPJ and there is no amount edema, erythema, increase in warmth. There is no restriction first MTPJ range of motion. No other areas of tenderness bilateral lower extremities.  No open lesions or pre-ulcerative lesions identified bilaterally. Nails appear to be hypertrophic, dystrophic, brittle, discolored, elongated 10. No surrounding erythema or drainage. There is tenderness to nails 1-5 bilaterally. No pain with calf compression, swelling, warmth, erythema  Assessment: 82 year old female with first MTPJ symptoms; symptomatic onychomycosis; PVD  Plan: -All treatment options discussed with the patient including all alternatives, risks, complications.  -Etiology of symptoms were discussed -Follow-up with Dr. Wyn Quakerew for PVD, angio -Nails sharply debrided x 10 without complications or bleeding.  -Symptoms have greatly improved the first MTPJ and the left side. Continue to monitor reoccurrence. -F/U 3 months -Patient encouraged to call the office with any questions, concerns, change in symptoms.   Ovid CurdMatthew Nima Bamburg, DPM

## 2016-01-05 ENCOUNTER — Other Ambulatory Visit: Payer: Self-pay | Admitting: Vascular Surgery

## 2016-01-06 ENCOUNTER — Other Ambulatory Visit
Admission: RE | Admit: 2016-01-06 | Discharge: 2016-01-06 | Disposition: A | Discharge status: Home / Self Care | Payer: Medicare HMO | Source: Ambulatory Visit | Attending: Vascular Surgery | Admitting: Vascular Surgery

## 2016-01-06 DIAGNOSIS — Z0000 Encounter for general adult medical examination without abnormal findings: Secondary | ICD-10-CM | POA: Insufficient documentation

## 2016-01-06 LAB — BUN: BUN: 22 mg/dL — AB (ref 6–20)

## 2016-01-06 LAB — CREATININE, SERUM
CREATININE: 1.12 mg/dL — AB (ref 0.44–1.00)
GFR calc non Af Amer: 45 mL/min — ABNORMAL LOW (ref >60)
GFR, EST AFRICAN AMERICAN: 52 mL/min — AB (ref >60)

## 2016-01-10 ENCOUNTER — Encounter: Payer: Self-pay | Admitting: *Deleted

## 2016-01-10 ENCOUNTER — Ambulatory Visit
Admission: RE | Admit: 2016-01-10 | Discharge: 2016-01-10 | Disposition: A | Discharge status: Home / Self Care | Payer: Medicare HMO | Source: Ambulatory Visit | Attending: Vascular Surgery | Admitting: Vascular Surgery

## 2016-01-10 ENCOUNTER — Encounter
Admission: RE | Disposition: A | Discharge status: Home / Self Care | Payer: Self-pay | Source: Ambulatory Visit | Attending: Vascular Surgery

## 2016-01-10 ENCOUNTER — Other Ambulatory Visit: Payer: Self-pay | Admitting: Vascular Surgery

## 2016-01-10 DIAGNOSIS — I868 Varicose veins of other specified sites: Secondary | ICD-10-CM | POA: Diagnosis not present

## 2016-01-10 DIAGNOSIS — F1021 Alcohol dependence, in remission: Secondary | ICD-10-CM | POA: Insufficient documentation

## 2016-01-10 DIAGNOSIS — I70228 Atherosclerosis of native arteries of extremities with rest pain, other extremity: Secondary | ICD-10-CM | POA: Insufficient documentation

## 2016-01-10 DIAGNOSIS — M79672 Pain in left foot: Secondary | ICD-10-CM | POA: Insufficient documentation

## 2016-01-10 DIAGNOSIS — D649 Anemia, unspecified: Secondary | ICD-10-CM | POA: Insufficient documentation

## 2016-01-10 DIAGNOSIS — Z800 Family history of malignant neoplasm of digestive organs: Secondary | ICD-10-CM | POA: Insufficient documentation

## 2016-01-10 DIAGNOSIS — Z885 Allergy status to narcotic agent status: Secondary | ICD-10-CM | POA: Diagnosis not present

## 2016-01-10 DIAGNOSIS — R42 Dizziness and giddiness: Secondary | ICD-10-CM | POA: Diagnosis not present

## 2016-01-10 DIAGNOSIS — M79671 Pain in right foot: Secondary | ICD-10-CM | POA: Diagnosis not present

## 2016-01-10 DIAGNOSIS — Z8249 Family history of ischemic heart disease and other diseases of the circulatory system: Secondary | ICD-10-CM | POA: Insufficient documentation

## 2016-01-10 DIAGNOSIS — N289 Disorder of kidney and ureter, unspecified: Secondary | ICD-10-CM | POA: Insufficient documentation

## 2016-01-10 DIAGNOSIS — M7989 Other specified soft tissue disorders: Secondary | ICD-10-CM | POA: Diagnosis not present

## 2016-01-10 DIAGNOSIS — E7800 Pure hypercholesterolemia, unspecified: Secondary | ICD-10-CM | POA: Diagnosis not present

## 2016-01-10 HISTORY — DX: Chronic kidney disease, unspecified: N18.9

## 2016-01-10 HISTORY — PX: PERIPHERAL VASCULAR CATHETERIZATION: SHX172C

## 2016-01-10 SURGERY — LOWER EXTREMITY ANGIOGRAPHY
Anesthesia: Moderate Sedation | Laterality: Left

## 2016-01-10 MED ORDER — ATORVASTATIN CALCIUM 20 MG PO TABS: 20.0000 mg | ORAL_TABLET | Freq: Every day | ORAL | Status: AC

## 2016-01-10 MED ORDER — FENTANYL CITRATE (PF) 100 MCG/2ML IJ SOLN
INTRAMUSCULAR | Status: AC
  Filled 2016-01-10: qty 2

## 2016-01-10 MED ORDER — MIDAZOLAM HCL 2 MG/2ML IJ SOLN
INTRAMUSCULAR | Status: DC | PRN
  Administered 2016-01-10 (×2): 2 mg via INTRAVENOUS

## 2016-01-10 MED ORDER — HEPARIN SODIUM (PORCINE) 1000 UNIT/ML IJ SOLN
INTRAMUSCULAR | Status: DC | PRN
  Administered 2016-01-10: 5000 [IU] via INTRAVENOUS

## 2016-01-10 MED ORDER — LIDOCAINE-EPINEPHRINE (PF) 1 %-1:200000 IJ SOLN
INTRAMUSCULAR | Status: AC
  Filled 2016-01-10: qty 30

## 2016-01-10 MED ORDER — DEXTROSE 5 % IV SOLN: 1.5000 g | INTRAVENOUS | Status: DC

## 2016-01-10 MED ORDER — SODIUM BICARBONATE 8.4 % IV SOLN
INTRAVENOUS | Status: AC
  Administered 2016-01-10: 09:00:00 via INTRAVENOUS
  Filled 2016-01-10: qty 1000

## 2016-01-10 MED ORDER — HEPARIN (PORCINE) IN NACL 2-0.9 UNIT/ML-% IJ SOLN
INTRAMUSCULAR | Status: AC
Start: 2016-01-10 — End: 2016-01-10
  Filled 2016-01-10: qty 1000

## 2016-01-10 MED ORDER — CLOPIDOGREL BISULFATE 75 MG PO TABS: 75.0000 mg | ORAL_TABLET | Freq: Every day | ORAL | Status: AC

## 2016-01-10 MED ORDER — FENTANYL CITRATE (PF) 100 MCG/2ML IJ SOLN
INTRAMUSCULAR | Status: DC | PRN
  Administered 2016-01-10 (×2): 50 ug via INTRAVENOUS

## 2016-01-10 MED ORDER — HEPARIN SODIUM (PORCINE) 1000 UNIT/ML IJ SOLN
INTRAMUSCULAR | Status: AC
  Filled 2016-01-10: qty 1

## 2016-01-10 MED ORDER — SODIUM BICARBONATE BOLUS VIA INFUSION
INTRAVENOUS | Status: AC
  Administered 2016-01-10: 09:00:00 via INTRAVENOUS
  Filled 2016-01-10: qty 1

## 2016-01-10 MED ORDER — ACETAMINOPHEN 325 MG PO TABS: 325.0000 mg | ORAL_TABLET | ORAL | Status: DC | PRN

## 2016-01-10 MED ORDER — PHENOL 1.4 % MT LIQD: 1.0000 | OROMUCOSAL | Status: DC | PRN

## 2016-01-10 MED ORDER — ONDANSETRON HCL 4 MG/2ML IJ SOLN
4.0000 mg | Freq: Four times a day (QID) | INTRAMUSCULAR | Status: DC | PRN
Start: 2016-01-10 — End: 2016-01-10

## 2016-01-10 MED ORDER — SODIUM CHLORIDE 0.9 % IV SOLN
INTRAVENOUS | Status: DC
  Administered 2016-01-10: 09:00:00 via INTRAVENOUS

## 2016-01-10 MED ORDER — METOPROLOL TARTRATE 1 MG/ML IV SOLN: 2.0000 mg | INTRAVENOUS | Status: DC | PRN

## 2016-01-10 MED ORDER — MIDAZOLAM HCL 5 MG/5ML IJ SOLN
INTRAMUSCULAR | Status: AC
  Filled 2016-01-10: qty 5

## 2016-01-10 MED ORDER — HYDRALAZINE HCL 20 MG/ML IJ SOLN: 5.0000 mg | INTRAMUSCULAR | Status: DC | PRN

## 2016-01-10 MED ORDER — FAMOTIDINE 20 MG PO TABS: 40.0000 mg | ORAL_TABLET | ORAL | Status: DC | PRN

## 2016-01-10 MED ORDER — SODIUM CHLORIDE 0.9 % IV SOLN: 500.0000 mL | Freq: Once | INTRAVENOUS | Status: DC | PRN

## 2016-01-10 MED ORDER — METHYLPREDNISOLONE SODIUM SUCC 125 MG IJ SOLR: 125.0000 mg | INTRAMUSCULAR | Status: DC | PRN

## 2016-01-10 MED ORDER — ONDANSETRON HCL 4 MG/2ML IJ SOLN: 4.0000 mg | Freq: Four times a day (QID) | INTRAMUSCULAR | Status: DC | PRN

## 2016-01-10 MED ORDER — HYDROMORPHONE HCL 1 MG/ML IJ SOLN: 0.5000 mg | INTRAMUSCULAR | Status: DC | PRN

## 2016-01-10 MED ORDER — OXYCODONE-ACETAMINOPHEN 5-325 MG PO TABS: 1.0000 | ORAL_TABLET | ORAL | Status: DC | PRN

## 2016-01-10 MED ORDER — GUAIFENESIN-DM 100-10 MG/5ML PO SYRP: 15.0000 mL | ORAL_SOLUTION | ORAL | Status: DC | PRN

## 2016-01-10 MED ORDER — ASPIRIN EC 81 MG PO TBEC: 81.0000 mg | DELAYED_RELEASE_TABLET | Freq: Every day | ORAL | Status: AC

## 2016-01-10 MED ORDER — ACETAMINOPHEN 325 MG RE SUPP: 325.0000 mg | RECTAL | Status: DC | PRN

## 2016-01-10 MED ORDER — HYDROMORPHONE HCL 1 MG/ML IJ SOLN: 1.0000 mg | Freq: Once | INTRAMUSCULAR | Status: DC

## 2016-01-10 MED ORDER — LABETALOL HCL 5 MG/ML IV SOLN: 10.0000 mg | INTRAVENOUS | Status: DC | PRN

## 2016-01-10 SURGICAL SUPPLY — 21 items
BALLN LUTONIX 5X150X130 (BALLOONS) ×8
BALLN LUTONIX DCB 4X80X130 (BALLOONS) ×4
BALLN ULTRVRSE 3X150X150 (BALLOONS) ×4
BALLN ULTRVRSE 4X300X150 (BALLOONS) ×4
BALLOON LUTONIX 5X150X130 (BALLOONS) ×4 IMPLANT
BALLOON LUTONIX DCB 4X80X130 (BALLOONS) ×2 IMPLANT
BALLOON ULTRVRSE 3X150X150 (BALLOONS) ×2 IMPLANT
BALLOON ULTRVRSE 4X300X150 (BALLOONS) ×2 IMPLANT
CATH PIG 70CM (CATHETERS) ×4 IMPLANT
CATH VERT 100CM (CATHETERS) ×4 IMPLANT
DEVICE PRESTO INFLATION (MISCELLANEOUS) ×4 IMPLANT
DEVICE STARCLOSE SE CLOSURE (Vascular Products) ×4 IMPLANT
GLIDEWIRE ADV .035X260CM (WIRE) ×4 IMPLANT
PACK ANGIOGRAPHY (CUSTOM PROCEDURE TRAY) ×4 IMPLANT
SHEATH ANL2 6FRX45 HC (SHEATH) ×4 IMPLANT
SHEATH BRITE TIP 5FRX11 (SHEATH) ×4 IMPLANT
STENT LIFESTENT 6X170X130 (Permanent Stent) ×4 IMPLANT
SYR MEDRAD MARK V 150ML (SYRINGE) ×4 IMPLANT
TUBING CONTRAST HIGH PRESS 72 (TUBING) ×4 IMPLANT
WIRE G V18X300CM (WIRE) ×4 IMPLANT
WIRE J 3MM .035X145CM (WIRE) ×4 IMPLANT

## 2016-01-10 NOTE — Op Note (Signed)
[facility] Percutaneous Study/Intervention Procedural Note   Date of Surgery: 01/10/2016  Surgeon(s):DEW,JASON   Assistants:none  Pre-operative Diagnosis: PAD with claudication and possible rest pain in both lower extremities  Post-operative diagnosis: Same  Procedure(s) Performed: 1. Ultrasound guidance for vascular access right femoral artery 2. Catheter placement into left peroneal artery from right femoral approach 3. Aortogram and selective left lower extremity angiogram 4. Percutaneous transluminal angioplasty of left peroneal artery with 3 mm diameter angioplasty balloon and tibioperoneal trunk with 4 mm diameter conventional and drug-coated angioplasty balloons 5. Percutaneous transluminal angioplasty of the left superficial femoral artery and above-knee popliteal artery with 5 mm diameter angioplasty balloons including 2 inflations with the Lutonix drug-coated angioplasty balloon one proximally and one distally  6.  Self-expanding stent placement to the mid to distal superficial femoral artery and proximal popliteal artery with 6 mm diameter by 17 cm length life stent 7. StarClose closure device right femoral artery  EBL: 25  Contrast: 65 cc  Fluoro Time: 7.2 minutes  Moderate Conscious Sedation Time: approximately 60 minutes using 4 mg of Versed and 100 mcg of Fentanyl  Indications: Patient is a 82 y.o.female with pain in her feet even at rest and pain in her legs with minimal activity. The patient has noninvasive study showing bilateral SFA occlusions. The patient is brought in for angiography for further evaluation and potential treatment. Risks and benefits are discussed and informed consent is obtained  Procedure: The patient was identified and appropriate procedural time out was performed. The patient was then placed supine on the table and prepped  and draped in the usual sterile fashion.Moderate conscious sedation was administered during a face to face encounter with the patient throughout the procedure with my supervision of the RN administering medicines and monitoring the patient's vital signs, pulse oximetry, telemetry and mental status throughout from the start of the procedure until the patient was taken to the recovery room. Ultrasound was used to evaluate the right common femoral artery. It was patent . A digital ultrasound image was acquired. A Seldinger needle was used to access the right common femoral artery under direct ultrasound guidance and a permanent image was performed. A 0.035 J wire was advanced without resistance and a 5Fr sheath was placed. Pigtail catheter was placed into the aorta and an AP aortogram was performed. This demonstrated mild to moderate stenosis of the right renal artery, high-grade stenosis of the left renal artery, with a normal aorta and iliac segments. I then crossed the aortic bifurcation and advanced to the left femoral head. Selective left lower extremity angiogram was then performed. This demonstrated a near flush occlusion of the SFA with reconstitution near Hunter's canal distally. There was then about a 50% stenosis in the popliteal artery just above the knee and about an 80-85% stenosis in the tibioperoneal trunk going into the proximal peroneal artery which was the only runoff distally. The patient was systemically heparinized and a 6 [place] Ansell sheath was then placed over the [employer] wire. I then used a Kumpe catheter and the advantage wire to navigate into the superficial femoral artery occlusion and cross this occlusion with minimal difficulty confirming intraluminal flow in the popliteal artery. I then exchanged for a 0.018 wire and crossed the tibioperoneal trunk stenosis without difficulty getting the catheter into the peroneal artery. I then predilated the SFA and popliteal artery  with a long 4 mm diameter 0.018 balloon. A second inflation was done with the intent to treat in  the popliteal artery and tibioperoneal trunk. There was still significant disease in the tibioperoneal trunk and proximal peroneal artery. I used a 3 mm diameter by 15 cm length balloon to treat the proximal peroneal artery and tibioperoneal trunk. This was inflated to 12 atm for 1 minute. A 4 mm diameter by 8 cm length Lutonix drug-coated angioplasty balloon was then used in the distal popliteal artery and tibioperoneal trunk. Completion angiogram following this showed less than 30% residual stenosis with a nonflow limiting dissection in the tibioperoneal trunk. I then used a 5 mm diameter by 15 cm length Lutonix drug-coated angioplasty balloon from the above-knee popliteal artery up to the distal SFA. A second inflation list balloon was done in the mid SFA. Another 5 mm diameter by 15 cm length Lutonix drug-coated angioplasty balloon was used to treat the proximal SFA. Each of these inflations were held for 1 minute and were to 10-12 atm. Completion angiogram showed a greater than 50% residual stenosis and dissection in the distal SFA going into the proximal popliteal artery. I elected to treat this area with a 6 mm diameter by 17 cm length life stent postdilated with a 5 mm balloon with excellent angiographic completion result and less than 20% residual stenosis. The proximal SFA had a less than 30% residual stenosis after the initial angioplasty and no stent was required there. Her flow distally was now reasonably brisk. I elected to terminate the procedure. The sheath was removed and StarClose closure device was deployed in the right femoral artery with excellent hemostatic result. The patient was taken to the recovery room in stable condition having tolerated the procedure well.  Findings:  Aortogram: Normal aorta and iliac segments. Left renal artery stenosis appeared high-grade with right renal  artery stenosis appearing mild to moderate. Left Lower Extremity: This demonstrated a near flush occlusion of the SFA with reconstitution near Hunter's canal distally. There was then about a 50% stenosis in the popliteal artery just above the knee and about an 80-85% stenosis in the tibioperoneal trunk going into the proximal peroneal artery which was the only runoff distally   Disposition: Patient was taken to the recovery room in stable condition having tolerated the procedure well.  Complications: None  DEW,JASON 01/10/2016 10:32 AM

## 2016-01-10 NOTE — Discharge Instructions (Signed)
Angiogram, Care After °Refer to this sheet in the next few weeks. These instructions provide you with information about caring for yourself after your procedure. Your health care provider may also give you more specific instructions. Your treatment has been planned according to current medical practices, but problems sometimes occur. Call your health care provider if you have any problems or questions after your procedure. °WHAT TO EXPECT AFTER THE PROCEDURE °After your procedure, it is typical to have the following: °· Bruising at the catheter insertion site that usually fades within 1-2 weeks. °· Blood collecting in the tissue (hematoma) that may be painful to the touch. It should usually decrease in size and tenderness within 1-2 weeks. °HOME CARE INSTRUCTIONS °· Take medicines only as directed by your health care provider. °· You may shower 24-48 hours after the procedure or as directed by your health care provider. Remove the bandage (dressing) and gently wash the site with plain soap and water. Pat the area dry with a clean towel. Do not rub the site, because this may cause bleeding. °· Do not take baths, swim, or use a hot tub until your health care provider approves. °· Check your insertion site every day for redness, swelling, or drainage. °· Do not apply powder or lotion to the site. °· Do not lift over 10 lb (4.5 kg) for 5 days after your procedure or as directed by your health care provider. °· Ask your health care provider when it is okay to: °¨ Return to work or school. °¨ Resume usual physical activities or sports. °¨ Resume sexual activity. °· Do not drive home if you are discharged the same day as the procedure. Have someone else drive you. °· You may drive 24 hours after the procedure unless otherwise instructed by your health care provider. °· Do not operate machinery or power tools for 24 hours after the procedure or as directed by your health care provider. °· If your procedure was done as an  outpatient procedure, which means that you went home the same day as your procedure, a responsible adult should be with you for the first 24 hours after you arrive home. °· Keep all follow-up visits as directed by your health care provider. This is important. °SEEK MEDICAL CARE IF: °· You have a fever. °· You have chills. °· You have increased bleeding from the catheter insertion site. Hold pressure on the site. °SEEK IMMEDIATE MEDICAL CARE IF: °· You have unusual pain at the catheter insertion site. °· You have redness, warmth, or swelling at the catheter insertion site. °· You have drainage (other than a small amount of blood on the dressing) from the catheter insertion site. °· The catheter insertion site is bleeding, and the bleeding does not stop after 30 minutes of holding steady pressure on the site. °· The area near or just beyond the catheter insertion site becomes pale, cool, tingly, or numb. °  °This information is not intended to replace advice given to you by your health care provider. Make sure you discuss any questions you have with your health care provider. °  °Document Released: 04/06/2005 Document Revised: 10/09/2014 Document Reviewed: 02/19/2013 °Elsevier Interactive Patient Education ©2016 [employer] ° °

## 2016-01-10 NOTE — H&P (Signed)
  [facility] History & Physical Update  The patient was interviewed and re-examined.  The patient's previous History and Physical has been reviewed and is unchanged.  There is no change in the plan of care. We plan to proceed with the scheduled procedure.  DEW,JASON, MD  01/10/2016, 9:18 AM

## 2016-01-11 ENCOUNTER — Encounter: Payer: Self-pay | Admitting: Vascular Surgery

## 2016-01-14 ENCOUNTER — Other Ambulatory Visit
Admission: RE | Admit: 2016-01-14 | Discharge: 2016-01-14 | Disposition: A | Discharge status: Home / Self Care | Payer: Medicare HMO | Source: Ambulatory Visit | Attending: Vascular Surgery | Admitting: Vascular Surgery

## 2016-01-14 DIAGNOSIS — Z01812 Encounter for preprocedural laboratory examination: Secondary | ICD-10-CM | POA: Insufficient documentation

## 2016-01-14 DIAGNOSIS — I739 Peripheral vascular disease, unspecified: Secondary | ICD-10-CM | POA: Diagnosis not present

## 2016-01-14 LAB — CREATININE, SERUM
Creatinine, Ser: 1.09 mg/dL — ABNORMAL HIGH (ref 0.44–1.00)
GFR calc Af Amer: 54 mL/min — ABNORMAL LOW (ref >60)
GFR calc non Af Amer: 47 mL/min — ABNORMAL LOW (ref >60)

## 2016-01-14 LAB — BUN: BUN: 24 mg/dL — AB (ref 6–20)

## 2016-01-17 ENCOUNTER — Encounter
Admission: RE | Disposition: A | Discharge status: Home / Self Care | Payer: Self-pay | Source: Ambulatory Visit | Attending: Vascular Surgery

## 2016-01-17 ENCOUNTER — Encounter: Payer: Self-pay | Admitting: *Deleted

## 2016-01-17 ENCOUNTER — Ambulatory Visit
Admission: RE | Admit: 2016-01-17 | Discharge: 2016-01-17 | Disposition: A | Discharge status: Home / Self Care | Payer: Medicare HMO | Source: Ambulatory Visit | Attending: Vascular Surgery | Admitting: Vascular Surgery

## 2016-01-17 DIAGNOSIS — E7800 Pure hypercholesterolemia, unspecified: Secondary | ICD-10-CM | POA: Diagnosis not present

## 2016-01-17 DIAGNOSIS — I868 Varicose veins of other specified sites: Secondary | ICD-10-CM | POA: Diagnosis not present

## 2016-01-17 DIAGNOSIS — I70218 Atherosclerosis of native arteries of extremities with intermittent claudication, other extremity: Secondary | ICD-10-CM | POA: Diagnosis not present

## 2016-01-17 DIAGNOSIS — M25552 Pain in left hip: Secondary | ICD-10-CM | POA: Insufficient documentation

## 2016-01-17 DIAGNOSIS — N289 Disorder of kidney and ureter, unspecified: Secondary | ICD-10-CM | POA: Diagnosis not present

## 2016-01-17 DIAGNOSIS — M7989 Other specified soft tissue disorders: Secondary | ICD-10-CM | POA: Diagnosis not present

## 2016-01-17 DIAGNOSIS — D649 Anemia, unspecified: Secondary | ICD-10-CM | POA: Insufficient documentation

## 2016-01-17 DIAGNOSIS — Z800 Family history of malignant neoplasm of digestive organs: Secondary | ICD-10-CM | POA: Diagnosis not present

## 2016-01-17 DIAGNOSIS — Z79899 Other long term (current) drug therapy: Secondary | ICD-10-CM | POA: Diagnosis not present

## 2016-01-17 DIAGNOSIS — R42 Dizziness and giddiness: Secondary | ICD-10-CM | POA: Diagnosis not present

## 2016-01-17 DIAGNOSIS — Z8249 Family history of ischemic heart disease and other diseases of the circulatory system: Secondary | ICD-10-CM | POA: Insufficient documentation

## 2016-01-17 DIAGNOSIS — Z885 Allergy status to narcotic agent status: Secondary | ICD-10-CM | POA: Diagnosis not present

## 2016-01-17 HISTORY — PX: PERIPHERAL VASCULAR CATHETERIZATION: SHX172C

## 2016-01-17 SURGERY — LOWER EXTREMITY ANGIOGRAPHY
Anesthesia: Moderate Sedation | Laterality: Right

## 2016-01-17 MED ORDER — METOPROLOL TARTRATE 1 MG/ML IV SOLN: 2.0000 mg | INTRAVENOUS | Status: DC | PRN

## 2016-01-17 MED ORDER — LIDOCAINE-EPINEPHRINE (PF) 1 %-1:200000 IJ SOLN
INTRAMUSCULAR | Status: AC
  Filled 2016-01-17: qty 30

## 2016-01-17 MED ORDER — SODIUM BICARBONATE 8.4 % IV SOLN
INTRAVENOUS | Status: DC
  Filled 2016-01-17: qty 500

## 2016-01-17 MED ORDER — HEPARIN SODIUM (PORCINE) 1000 UNIT/ML IJ SOLN
INTRAMUSCULAR | Status: AC
  Filled 2016-01-17: qty 1

## 2016-01-17 MED ORDER — CEFUROXIME SODIUM 1.5 G IJ SOLR: 1.5000 g | INTRAMUSCULAR | Status: DC

## 2016-01-17 MED ORDER — LIDOCAINE-EPINEPHRINE (PF) 1 %-1:200000 IJ SOLN
INTRAMUSCULAR | Status: DC | PRN
  Administered 2016-01-17: 10 mL via INTRADERMAL

## 2016-01-17 MED ORDER — PHENOL 1.4 % MT LIQD: 1.0000 | OROMUCOSAL | Status: DC | PRN

## 2016-01-17 MED ORDER — HYDRALAZINE HCL 20 MG/ML IJ SOLN: 5.0000 mg | INTRAMUSCULAR | Status: DC | PRN

## 2016-01-17 MED ORDER — HEPARIN (PORCINE) IN NACL 2-0.9 UNIT/ML-% IJ SOLN
INTRAMUSCULAR | Status: AC
  Filled 2016-01-17: qty 1000

## 2016-01-17 MED ORDER — HEPARIN SODIUM (PORCINE) 1000 UNIT/ML IJ SOLN
INTRAMUSCULAR | Status: DC | PRN
Start: 2016-01-17 — End: 2016-01-17
  Administered 2016-01-17: 4000 [IU] via INTRAVENOUS

## 2016-01-17 MED ORDER — FAMOTIDINE 20 MG PO TABS: 40.0000 mg | ORAL_TABLET | ORAL | Status: DC | PRN

## 2016-01-17 MED ORDER — HYDROMORPHONE HCL 1 MG/ML IJ SOLN: 1.0000 mg | Freq: Once | INTRAMUSCULAR | Status: DC

## 2016-01-17 MED ORDER — IOPAMIDOL (ISOVUE-300) INJECTION 61%
INTRAVENOUS | Status: DC | PRN
  Administered 2016-01-17: 75 mL via INTRA_ARTERIAL

## 2016-01-17 MED ORDER — ACETAMINOPHEN 325 MG PO TABS: 325.0000 mg | ORAL_TABLET | ORAL | Status: DC | PRN

## 2016-01-17 MED ORDER — SODIUM BICARBONATE BOLUS VIA INFUSION
INTRAVENOUS | Status: AC
  Administered 2016-01-17: 09:00:00 via INTRAVENOUS
  Filled 2016-01-17: qty 1

## 2016-01-17 MED ORDER — SODIUM CHLORIDE 0.9 % IV SOLN
INTRAVENOUS | Status: DC
  Administered 2016-01-17 (×2): via INTRAVENOUS

## 2016-01-17 MED ORDER — GUAIFENESIN-DM 100-10 MG/5ML PO SYRP: 15.0000 mL | ORAL_SOLUTION | ORAL | Status: DC | PRN

## 2016-01-17 MED ORDER — DEXTROSE 5 % IV SOLN
INTRAVENOUS | Status: AC
  Filled 2016-01-17 (×11): qty 1.5

## 2016-01-17 MED ORDER — OXYCODONE-ACETAMINOPHEN 5-325 MG PO TABS: 1.0000 | ORAL_TABLET | ORAL | Status: DC | PRN

## 2016-01-17 MED ORDER — FENTANYL CITRATE (PF) 100 MCG/2ML IJ SOLN
INTRAMUSCULAR | Status: DC | PRN
  Administered 2016-01-17 (×2): 50 ug via INTRAVENOUS

## 2016-01-17 MED ORDER — FENTANYL CITRATE (PF) 100 MCG/2ML IJ SOLN
INTRAMUSCULAR | Status: AC
  Filled 2016-01-17: qty 2

## 2016-01-17 MED ORDER — ONDANSETRON HCL 4 MG/2ML IJ SOLN: 4.0000 mg | Freq: Four times a day (QID) | INTRAMUSCULAR | Status: DC | PRN

## 2016-01-17 MED ORDER — SODIUM CHLORIDE 0.9 % IV SOLN: 500.0000 mL | Freq: Once | INTRAVENOUS | Status: DC | PRN

## 2016-01-17 MED ORDER — MIDAZOLAM HCL 2 MG/2ML IJ SOLN
INTRAMUSCULAR | Status: DC | PRN
  Administered 2016-01-17: 1 mg via INTRAVENOUS
  Administered 2016-01-17: 2 mg via INTRAVENOUS

## 2016-01-17 MED ORDER — MIDAZOLAM HCL 5 MG/5ML IJ SOLN
INTRAMUSCULAR | Status: AC
  Filled 2016-01-17: qty 5

## 2016-01-17 MED ORDER — METHYLPREDNISOLONE SODIUM SUCC 125 MG IJ SOLR
125.0000 mg | INTRAMUSCULAR | Status: DC | PRN
Start: 2016-01-17 — End: 2016-01-17

## 2016-01-17 MED ORDER — LABETALOL HCL 5 MG/ML IV SOLN: 10.0000 mg | INTRAVENOUS | Status: DC | PRN

## 2016-01-17 MED ORDER — ACETAMINOPHEN 325 MG RE SUPP: 325.0000 mg | RECTAL | Status: DC | PRN

## 2016-01-17 MED ORDER — HYDROMORPHONE HCL 1 MG/ML IJ SOLN: 0.5000 mg | INTRAMUSCULAR | Status: DC | PRN

## 2016-01-17 SURGICAL SUPPLY — 23 items
BALLN LUTONIX 5X150X130 (BALLOONS) ×8
BALLN ULTRVRSE 3X300X150 (BALLOONS) ×2
BALLN ULTRVRSE 3X300X150 OTW (BALLOONS) ×2
BALLOON LUTONIX 5X150X130 (BALLOONS) ×4 IMPLANT
BALLOON ULTRVRSE 3X300X150 OTW (BALLOONS) ×2 IMPLANT
CATH CXI SUPP ANG 4FR 135 (MICROCATHETER) ×2 IMPLANT
CATH CXI SUPP ANG 4FR 135CM (MICROCATHETER) ×4
CATH KA2 5FR 65CM (CATHETERS) ×4 IMPLANT
CATH PIG 70CM (CATHETERS) ×4 IMPLANT
CATH RIM 65CM (CATHETERS) ×4 IMPLANT
CATH VERT 100CM (CATHETERS) ×4 IMPLANT
DEVICE PRESTO INFLATION (MISCELLANEOUS) ×4 IMPLANT
DEVICE STARCLOSE SE CLOSURE (Vascular Products) ×4 IMPLANT
GLIDEWIRE ADV .035X180CM (WIRE) ×4 IMPLANT
GLIDEWIRE ADV .035X260CM (WIRE) ×4 IMPLANT
GUIDEWIRE PFTE-COATED .018X300 (WIRE) ×4 IMPLANT
PACK ANGIOGRAPHY (CUSTOM PROCEDURE TRAY) ×4 IMPLANT
SHEATH ANL2 6FRX45 HC (SHEATH) ×4 IMPLANT
SHEATH BRITE TIP 5FRX11 (SHEATH) ×4 IMPLANT
STENT VIABAHN 6X250X120 (Permanent Stent) ×4 IMPLANT
SYR MEDRAD MARK V 150ML (SYRINGE) ×4 IMPLANT
TUBING CONTRAST HIGH PRESS 72 (TUBING) ×4 IMPLANT
WIRE J 3MM .035X145CM (WIRE) ×4 IMPLANT

## 2016-01-17 NOTE — Op Note (Signed)
[facility] Percutaneous Study/Intervention Procedural Note   Date of Surgery: 01/17/2016  Surgeon(s):DEW,JASON   Assistants:none  Pre-operative Diagnosis: PAD with claudication and symptoms worrisome for early rest pain of both lower extremities. Status post left lower extremity revascularization  Post-operative diagnosis: Same  Procedure(s) Performed: 1. Ultrasound guidance for vascular access left femoral artery 2. Catheter placement into right peroneal artery from left femoral approach 3. selective right lower extremity angiogram 4. Percutaneous transluminal angioplasty of tibioperoneal trunk and proximal peroneal artery with 3 mm diameter angioplasty balloon 5. Percutaneous transluminal angioplasty of the entire SFA and above-knee popliteal artery with 5 mm diameter angioplasty balloons including Lutonix drug-coated angioplasty balloon proximally and distally  6.  Viabahn stent placement to the mid to distal SFA and above-knee popliteal artery for multiple areas of greater than 50% stenosis and dissection after angioplasty using 6 mm diameter by 25 cm length stent 7. StarClose closure device left femoral artery  EBL: 25 cc  Contrast: 75 cc  Fluoro Time: 7.2 minutes  Moderate Conscious Sedation Time: approximately 50 minutes using 3 mg of Versed and 100 mcg of Fentanyl  Indications: Patient is a 82 y.o.female with severe peripheral vascular disease and very short distance claudication with symptoms worrisome for ischemic rest pain starting in both feet. She is artery undergone left lower extremity revascularization with improvement in symptoms.. The patient has noninvasive study showing bilateral SFA occlusions. The patient is brought in for angiography for further evaluation and potential treatment. Risks and benefits are discussed and informed consent is  obtained  Procedure: The patient was identified and appropriate procedural time out was performed. The patient was then placed supine on the table and prepped and draped in the usual sterile fashion.Moderate conscious sedation was administered during a face to face encounter with the patient throughout the procedure with my supervision of the RN administering medicines and monitoring the patient's vital signs, pulse oximetry, telemetry and mental status throughout from the start of the procedure until the patient was taken to the recovery room. Ultrasound was used to evaluate the left common femoral artery. It was patent . A digital ultrasound image was acquired. A Seldinger needle was used to access the left common femoral artery under direct ultrasound guidance and a permanent image was performed. A 0.035 J wire was advanced without resistance and a 5Fr sheath was placed. As we had done an aortogram last week, this was not necessary. I then crossed the aortic bifurcation and advanced to the right femoral head. Selective right lower extremity angiogram was then performed. This demonstrated flush SFA occlusion with reconstitution in the above-knee popliteal artery. 75-80% stenosis of the tibioperoneal trunk and proximal peroneal artery which was the only runoff distally. . The patient was systemically heparinized and a 6 [place] Ansell sheath was then placed over the [employer] wire. I then used a Kumpe catheter and the advantage wire to navigate through the SFA and popliteal occlusion and confirm intraluminal flow in the below-knee popliteal artery. I then exchanged for a 0.018 advantage wire and a CXI catheter and was able to advance through the tibioperoneal trunk and proximal peroneal stenosis and parked the wire distally. I then proceeded with treatment. A 3 mm diameter by 30 cm length angioplasty balloon was used to treat the tibioperoneal trunk and proximal peroneal artery as well as to  predilate the SFA and popliteal artery to allow larger balloons. The tibioperoneal trunk and proximal peroneal artery inflation was treated for 1 minute 12 atm  with only about a 15% residual stenosis identified after angioplasty. I then turned my attention to the SFA and popliteal arteries. 5 mm diameter by 15 cm length Lutonix drug-coated angioplasty balloon was started just above the knee to treat the popliteal artery initially. This was inflated to 12 atm for 1 minute. I then pulled this back and treated the mid to distal SFA with inflation to 10 atm for 1 minute. The proximal SFA up to its origin was then treated with a 5 mm diameter by 15 cm length Lutonix drug-coated angioplasty balloon inflated to 10 atm for 1 minute. Completion and gram following this showed the most proximal 10-12 cm to be widely patent but then there was an area of moderate to high-grade residual stenosis in the SFA. Below this, was a spiral dissection and stenosis into the above-knee popliteal artery at the reentry point. I elected to treat this area with a long stent. A 6 mm diameter by 25 cm length Viabahn covered stent was used to treat from the mid SFA down to the above-knee popliteal artery. This was postdilated with a 5 mm balloon with excellent angiographic completion result and less than 20% residual stenosis. I elected to terminate the procedure. The sheath was removed and StarClose closure device was deployed in the left femoral artery with excellent hemostatic result. The patient was taken to the recovery room in stable condition having tolerated the procedure well.  Findings:   Right Lower Extremity: Flush SFA occlusion with reconstitution in the above-knee popliteal artery. 75-80% stenosis of the tibioperoneal trunk and proximal peroneal artery which was the only runoff distally.   Disposition: Patient was taken to the recovery room in stable condition having tolerated the procedure  well.  Complications: None  DEW,JASON 01/17/2016 10:25 AM

## 2016-01-17 NOTE — H&P (Signed)
  [facility] History & Physical Update  The patient was interviewed and re-examined.  The patient's previous History and Physical has been reviewed and is unchanged.  There is no change in the plan of care. We plan to proceed with the scheduled procedure.  DEW,JASON, MD  01/17/2016, 8:10 AM

## 2016-01-17 NOTE — Discharge Instructions (Signed)
Angiogram, Care After °Refer to this sheet in the next few weeks. These instructions provide you with information about caring for yourself after your procedure. Your health care provider may also give you more specific instructions. Your treatment has been planned according to current medical practices, but problems sometimes occur. Call your health care provider if you have any problems or questions after your procedure. °WHAT TO EXPECT AFTER THE PROCEDURE °After your procedure, it is typical to have the following: °· Bruising at the catheter insertion site that usually fades within 1-2 weeks. °· Blood collecting in the tissue (hematoma) that may be painful to the touch. It should usually decrease in size and tenderness within 1-2 weeks. °HOME CARE INSTRUCTIONS °· Take medicines only as directed by your health care provider. °· You may shower 24-48 hours after the procedure or as directed by your health care provider. Remove the bandage (dressing) and gently wash the site with plain soap and water. Pat the area dry with a clean towel. Do not rub the site, because this may cause bleeding. °· Do not take baths, swim, or use a hot tub until your health care provider approves. °· Check your insertion site every day for redness, swelling, or drainage. °· Do not apply powder or lotion to the site. °· Do not lift over 10 lb (4.5 kg) for 5 days after your procedure or as directed by your health care provider. °· Ask your health care provider when it is okay to: °¨ Return to work or school. °¨ Resume usual physical activities or sports. °¨ Resume sexual activity. °· Do not drive home if you are discharged the same day as the procedure. Have someone else drive you. °· You may drive 24 hours after the procedure unless otherwise instructed by your health care provider. °· Do not operate machinery or power tools for 24 hours after the procedure or as directed by your health care provider. °· If your procedure was done as an  outpatient procedure, which means that you went home the same day as your procedure, a responsible adult should be with you for the first 24 hours after you arrive home. °· Keep all follow-up visits as directed by your health care provider. This is important. °SEEK MEDICAL CARE IF: °· You have a fever. °· You have chills. °· You have increased bleeding from the catheter insertion site. Hold pressure on the site. °SEEK IMMEDIATE MEDICAL CARE IF: °· You have unusual pain at the catheter insertion site. °· You have redness, warmth, or swelling at the catheter insertion site. °· You have drainage (other than a small amount of blood on the dressing) from the catheter insertion site. °· The catheter insertion site is bleeding, and the bleeding does not stop after 30 minutes of holding steady pressure on the site. °· The area near or just beyond the catheter insertion site becomes pale, cool, tingly, or numb. °  °This information is not intended to replace advice given to you by your health care provider. Make sure you discuss any questions you have with your health care provider. °  °Document Released: 04/06/2005 Document Revised: 10/09/2014 Document Reviewed: 02/19/2013 °Elsevier Interactive Patient Education ©2016 [employer] ° °

## 2016-01-18 ENCOUNTER — Encounter: Payer: Self-pay | Admitting: Vascular Surgery

## 2016-01-18 NOTE — Telephone Encounter (Signed)
Entered in error

## 2016-05-25 ENCOUNTER — Telehealth: Payer: Self-pay

## 2016-05-25 NOTE — Telephone Encounter (Signed)
Patient has a torn ligament in her left hip and wants to know if Dr. Tonita CongWoodham can fix it. I called her back and told her that she would need to see an orthopedic for that. Patient understood.

## 2016-08-13 ENCOUNTER — Ambulatory Visit
Admission: EM | Admit: 2016-08-13 | Discharge: 2016-08-13 | Disposition: A | Discharge status: Home / Self Care | Payer: Medicare HMO | Attending: Family Medicine | Admitting: Family Medicine

## 2016-08-13 ENCOUNTER — Encounter: Payer: Self-pay | Admitting: Gynecology

## 2016-08-13 ENCOUNTER — Ambulatory Visit (INDEPENDENT_AMBULATORY_CARE_PROVIDER_SITE_OTHER): Payer: Medicare HMO

## 2016-08-13 DIAGNOSIS — M25562 Pain in left knee: Secondary | ICD-10-CM

## 2016-08-13 HISTORY — DX: Unspecified asthma, uncomplicated: J45.909

## 2016-08-13 MED ORDER — HYDROCODONE-ACETAMINOPHEN 5-325 MG PO TABS: 1.0000 | ORAL_TABLET | Freq: Three times a day (TID) | ORAL | 0 refills | Status: DC | PRN

## 2016-08-13 NOTE — ED Triage Notes (Signed)
Patient c/o fell in the parking lot x days ago. Patient stated her left knee gave out and she fell injuring her left knee. Patient with left knee pain / swelling.

## 2016-08-13 NOTE — ED Provider Notes (Signed)
[facility]    CSN: [id number] Arrival date & time: 08/13/16  1256  History   Chief Complaint Chief Complaint  Patient presents with  . Knee Pain   HPI  82 year old female with an extensive past medical history including peripheral vascular disease presents with complaints of left knee pain.  Patient was in town on Friday and suffered a fall. She states that her knee gave way and she fell directly onto her left knee. She reports severe left knee pain and swelling. She reports she has difficulty bearing weight. She has been taking her home gabapentin without improvement. Exacerbated by movement and weightbearing. No relieving factors. No other associated symptoms. No other complaints this time.  Past Medical History:  Diagnosis Date  . Arthritis   . Asthma   . Chronic kidney disease   . Depression   . Hypertension    Patient Active Problem List   Diagnosis Date Noted  . PVD (peripheral vascular disease) (HCC) 01/04/2016   Past Surgical History:  Procedure Laterality Date  . ABDOMINAL HYSTERECTOMY    . PERIPHERAL VASCULAR CATHETERIZATION Left 01/10/2016   Procedure: Lower Extremity Angiography;  Surgeon: Annice NeedyJason S Dew, MD;  Location: [facility];  Service: Cardiovascular;  Laterality: Left;  . PERIPHERAL VASCULAR CATHETERIZATION  01/10/2016   Procedure: Lower Extremity Intervention;  Surgeon: Annice NeedyJason S Dew, MD;  Location: [facility];  Service: Cardiovascular;;  . PERIPHERAL VASCULAR CATHETERIZATION Right 01/17/2016   Procedure: Lower Extremity Angiography;  Surgeon: Annice NeedyJason S Dew, MD;  Location: [facility];  Service: Cardiovascular;  Laterality: Right;  . PERIPHERAL VASCULAR CATHETERIZATION  01/17/2016   Procedure: Lower Extremity Intervention;  Surgeon: Annice NeedyJason S Dew, MD;  Location: [facility];  Service: Cardiovascular;;  . THYROID SURGERY     OB History    No data available     Home Medications    Prior to Admission medications    Medication Sig Start Date End Date Taking? Authorizing Provider  aspirin EC 81 MG tablet Take 1 tablet (81 mg total) by mouth daily. 01/10/16  Yes Annice NeedyJason S Dew, MD  atorvastatin (LIPITOR) 20 MG tablet Take 1 tablet (20 mg total) by mouth daily. 01/10/16  Yes Annice NeedyJason S Dew, MD  clopidogrel (PLAVIX) 75 MG tablet Take 1 tablet (75 mg total) by mouth daily. 01/10/16  Yes Annice NeedyJason S Dew, MD  colchicine 0.6 MG tablet Take 1 tablet (0.6 mg total) by mouth daily. 12/11/14  Yes Vivi BarrackMatthew R Wagoner, DPM  montelukast (SINGULAIR) 10 MG tablet  11/24/14  Yes Historical Provider, MD  Vitamin D, Ergocalciferol, (DRISDOL) 50000 UNITS CAPS capsule Reported on 01/10/2016 11/04/14  Yes Historical Provider, MD  HYDROcodone-acetaminophen (NORCO/VICODIN) 5-325 MG tablet Take 1 tablet by mouth every 8 (eight) hours as needed. 08/13/16   Tommie SamsJayce G Palmina Clodfelter, DO  omeprazole (PRILOSEC) 40 MG capsule Reported on 01/17/2016 10/18/14   Historical Provider, MD    Family History History reviewed. No pertinent family history.  Social History Social History  Substance Use Topics  . Smoking status: Current Every Day Smoker    Packs/day: 0.50  . Smokeless tobacco: Never Used  . Alcohol use No    Allergies   Codeine and Symbicort [budesonide-formoterol fumarate]   Review of Systems Review of Systems  Musculoskeletal: Positive for joint swelling.       Left knee pain  All other systems reviewed and are negative.  Physical Exam Triage Vital Signs ED Triage Vitals  Enc Vitals Group  BP 08/13/16 1436 (!) 153/46     Pulse Rate 08/13/16 1436 81     Resp 08/13/16 1436 16     Temp 08/13/16 1436 98.3 F (36.8 C)     Temp Source 08/13/16 1436 Oral     SpO2 08/13/16 1436 100 %     Weight 08/13/16 1437 184 lb (83.5 kg)     Height 08/13/16 1437 5\' 7"  (1.702 m)     Head Circumference --      Peak Flow --      Pain Score --      Pain Loc --      Pain Edu? --      Excl. in GC? --    Updated Vital Signs BP (!) 153/46 (BP Location:  Left Arm)   Pulse 81   Temp 98.3 F (36.8 C) (Oral)   Resp 16   Ht 5\' 7"  (1.702 m)   Wt 184 lb (83.5 kg)   SpO2 100%   BMI 28.82 kg/m   Physical Exam  Constitutional: She is oriented to person, place, and time.  Chronically ill-appearing elderly female in no acute distress.  HENT:  Head: Normocephalic and atraumatic.  Eyes: Conjunctivae are normal.  Neck: Normal range of motion.  Cardiovascular: Normal rate and regular rhythm.   2/6 systolic murmur.  Pulmonary/Chest: Effort normal and breath sounds normal.  Abdominal: Soft. She exhibits no distension.  Musculoskeletal:  Left knee - effusion noted. No surrounding erythema. Marked tenderness to palpation of the medial joint line. Also has tenderness of the lateral joint line. Patient has difficulty extending her knee fully. Ligaments intact.  Neurological: She is alert and oriented to person, place, and time.  Skin: Skin is dry. No rash noted.  Psychiatric: She has a normal mood and affect.  Vitals reviewed.  UC Treatments / Results  Labs (all labs ordered are listed, but only abnormal results are displayed) Labs Reviewed - No data to display  EKG  EKG Interpretation None      Radiology Dg Knee Complete 4 Views Left  Result Date: 08/13/2016 CLINICAL DATA:  Fall onto knee. EXAM: LEFT KNEE - COMPLETE 4+ VIEW COMPARISON:  None. FINDINGS: Small suprapatellar joint effusion. Moderate tricompartment osteoarthritis is noted. There is chondrocalcinosis. No fracture or subluxation. IMPRESSION: 1. Moderate tricompartment osteoarthritis and chondrocalcinosis. 2. Small joint effusion. Electronically Signed   By: Signa Kellaylor  Stroud M.D.   On: 08/13/2016 15:50    Procedures Procedures (including critical care time)  Medications Ordered in UC Medications - No data to display   Initial Impression / Assessment and Plan / UC Course  I have reviewed the triage vital signs and the nursing notes.  Pertinent labs & imaging results that  were available during my care of the patient were reviewed by me and considered in my medical decision making (see chart for details).  Clinical Course   82 year old female presents with complaints of knee pain after suffering a fall. X-ray negative for fracture. Patient experiencing significant pain. This is subsequent to injury on top of moderate underlying osteophyte arthritis. Treating with Vicodin. Follow-up with PCP as needed.  Final Clinical Impressions(s) / UC Diagnoses   Final diagnoses:  Acute pain of left knee   New Prescriptions New Prescriptions   HYDROCODONE-ACETAMINOPHEN (NORCO/VICODIN) 5-325 MG TABLET    Take 1 tablet by mouth every 8 (eight) hours as needed.     Tommie SamsJayce G Josepha Barbier, DO 08/13/16 [id number]

## 2016-08-13 NOTE — Discharge Instructions (Signed)
Be careful walking and moving about.  Take the pain medication as needed.  Follow up closely with your primary physician.  Take care  Dr. Adriana Simasook

## 2016-09-01 ENCOUNTER — Emergency Department (HOSPITAL_COMMUNITY): Payer: Medicare HMO

## 2016-09-01 ENCOUNTER — Observation Stay (HOSPITAL_COMMUNITY)
Admission: EM | Admit: 2016-09-01 | Discharge: 2016-09-03 | Disposition: A | Discharge status: Home / Self Care | Payer: Medicare HMO | Attending: Family Medicine | Admitting: Family Medicine

## 2016-09-01 ENCOUNTER — Encounter (HOSPITAL_COMMUNITY): Payer: Self-pay

## 2016-09-01 DIAGNOSIS — R296 Repeated falls: Secondary | ICD-10-CM | POA: Insufficient documentation

## 2016-09-01 DIAGNOSIS — R202 Paresthesia of skin: Secondary | ICD-10-CM | POA: Diagnosis present

## 2016-09-01 DIAGNOSIS — I739 Peripheral vascular disease, unspecified: Secondary | ICD-10-CM | POA: Diagnosis not present

## 2016-09-01 DIAGNOSIS — Z7902 Long term (current) use of antithrombotics/antiplatelets: Secondary | ICD-10-CM | POA: Diagnosis not present

## 2016-09-01 DIAGNOSIS — Z95820 Peripheral vascular angioplasty status with implants and grafts: Secondary | ICD-10-CM | POA: Diagnosis not present

## 2016-09-01 DIAGNOSIS — F17210 Nicotine dependence, cigarettes, uncomplicated: Secondary | ICD-10-CM | POA: Diagnosis not present

## 2016-09-01 DIAGNOSIS — Z79899 Other long term (current) drug therapy: Secondary | ICD-10-CM | POA: Insufficient documentation

## 2016-09-01 DIAGNOSIS — I129 Hypertensive chronic kidney disease with stage 1 through stage 4 chronic kidney disease, or unspecified chronic kidney disease: Secondary | ICD-10-CM | POA: Insufficient documentation

## 2016-09-01 DIAGNOSIS — N189 Chronic kidney disease, unspecified: Secondary | ICD-10-CM | POA: Diagnosis not present

## 2016-09-01 DIAGNOSIS — M25561 Pain in right knee: Secondary | ICD-10-CM | POA: Insufficient documentation

## 2016-09-01 DIAGNOSIS — W19XXXD Unspecified fall, subsequent encounter: Secondary | ICD-10-CM | POA: Diagnosis not present

## 2016-09-01 DIAGNOSIS — Z7982 Long term (current) use of aspirin: Secondary | ICD-10-CM | POA: Diagnosis not present

## 2016-09-01 DIAGNOSIS — R262 Difficulty in walking, not elsewhere classified: Secondary | ICD-10-CM | POA: Diagnosis not present

## 2016-09-01 DIAGNOSIS — M25562 Pain in left knee: Secondary | ICD-10-CM | POA: Diagnosis not present

## 2016-09-01 DIAGNOSIS — F329 Major depressive disorder, single episode, unspecified: Secondary | ICD-10-CM | POA: Insufficient documentation

## 2016-09-01 DIAGNOSIS — R29898 Other symptoms and signs involving the musculoskeletal system: Secondary | ICD-10-CM | POA: Diagnosis present

## 2016-09-01 DIAGNOSIS — J45909 Unspecified asthma, uncomplicated: Secondary | ICD-10-CM | POA: Diagnosis not present

## 2016-09-01 LAB — I-STAT TROPONIN, ED: TROPONIN I, POC: 0 ng/mL (ref 0.00–0.08)

## 2016-09-01 LAB — DIFFERENTIAL
Basophils Absolute: 0 10*3/uL (ref 0.0–0.1)
Basophils Relative: 0 %
Eosinophils Absolute: 0.1 10*3/uL (ref 0.0–0.7)
Eosinophils Relative: 3 %
Lymphocytes Relative: 22 %
Lymphs Abs: 1 10*3/uL (ref 0.7–4.0)
Monocytes Absolute: 0.3 10*3/uL (ref 0.1–1.0)
Monocytes Relative: 7 %
Neutro Abs: 3.2 10*3/uL (ref 1.7–7.7)
Neutrophils Relative %: 68 %

## 2016-09-01 LAB — I-STAT CHEM 8, ED
BUN: 24 mg/dL — ABNORMAL HIGH (ref 6–20)
Calcium, Ion: 1 mmol/L — ABNORMAL LOW (ref 1.15–1.40)
Chloride: 107 mmol/L (ref 101–111)
Creatinine, Ser: 1.3 mg/dL — ABNORMAL HIGH (ref 0.44–1.00)
Glucose, Bld: 92 mg/dL (ref 65–99)
HCT: 34 % — ABNORMAL LOW (ref 36.0–46.0)
Hemoglobin: 11.6 g/dL — ABNORMAL LOW (ref 12.0–15.0)
Potassium: 4.2 mmol/L (ref 3.5–5.1)
Sodium: 137 mmol/L (ref 135–145)
TCO2: 21 mmol/L (ref 0–100)

## 2016-09-01 LAB — COMPREHENSIVE METABOLIC PANEL
ALT: 10 U/L — ABNORMAL LOW (ref 14–54)
ANION GAP: 13 (ref 5–15)
AST: 18 U/L (ref 15–41)
Albumin: 3.8 g/dL (ref 3.5–5.0)
Alkaline Phosphatase: 97 U/L (ref 38–126)
BILIRUBIN TOTAL: 0.6 mg/dL (ref 0.3–1.2)
BUN: 22 mg/dL — AB (ref 6–20)
CALCIUM: 9.2 mg/dL (ref 8.9–10.3)
CO2: 21 mmol/L — ABNORMAL LOW (ref 22–32)
Chloride: 103 mmol/L (ref 101–111)
Creatinine, Ser: 1.26 mg/dL — ABNORMAL HIGH (ref 0.44–1.00)
GFR calc Af Amer: 45 mL/min — ABNORMAL LOW (ref >60)
GFR, EST NON AFRICAN AMERICAN: 39 mL/min — AB (ref >60)
Glucose, Bld: 94 mg/dL (ref 65–99)
POTASSIUM: 3.9 mmol/L (ref 3.5–5.1)
Sodium: 137 mmol/L (ref 135–145)
TOTAL PROTEIN: 7.2 g/dL (ref 6.5–8.1)

## 2016-09-01 LAB — CBC
HEMATOCRIT: 33.4 % — AB (ref 36.0–46.0)
HEMOGLOBIN: 10.9 g/dL — AB (ref 12.0–15.0)
MCH: 31.1 pg (ref 26.0–34.0)
MCHC: 32.6 g/dL (ref 30.0–36.0)
MCV: 95.4 fL (ref 78.0–100.0)
Platelets: 207 10*3/uL (ref 150–400)
RBC: 3.5 MIL/uL — ABNORMAL LOW (ref 3.87–5.11)
RDW: 13.1 % (ref 11.5–15.5)
WBC: 4.7 10*3/uL (ref 4.0–10.5)

## 2016-09-01 LAB — PROTIME-INR
INR: 1.07
Prothrombin Time: 14 seconds (ref 11.4–15.2)

## 2016-09-01 LAB — APTT: aPTT: 31 s (ref 24–36)

## 2016-09-01 LAB — CBG MONITORING, ED: GLUCOSE-CAPILLARY: 98 mg/dL (ref 65–99)

## 2016-09-01 NOTE — ED Triage Notes (Signed)
Pt. Larey SeatFell in a parking lot a week ago this past Friday and injured her lt. Knee and lt. Foot/  She has swelling noted to the lt knee and pain.  This past Tuesday she has developed rt. Leg numbness the complete rt. Leg .   She also has lt. Arm and hand numbness which her doctor diagnosed her with neuropathy.   Pt. Is alert and oriented X 4  She is unable to at and without assistance.  Skin is warm and dry.  She is moving all extremities and speech is clear.

## 2016-09-01 NOTE — ED Notes (Signed)
Patient transported to X-ray 

## 2016-09-01 NOTE — ED Notes (Signed)
Pt ambulated with walker did need assistance twice due to tripping due to knee

## 2016-09-01 NOTE — ED Provider Notes (Signed)
[facility] Provider Note   CSN: [id number] Arrival date & time: 09/01/16  1727     History   Chief Complaint Chief Complaint  Patient presents with  . Fall  . Numbness    HPI Nancy Larsen is a 82 y.o. female.   Knee Pain   This is a chronic problem. The current episode started more than 1 week ago. The problem occurs constantly. The problem has been gradually worsening. The pain is present in the left knee. The quality of the pain is described as aching. The pain is at a severity of 7/10. The pain is moderate. Associated symptoms include numbness (hx of neuropathy). The symptoms are aggravated by activity. She has tried nothing for the symptoms. The treatment provided no relief. There has been a history of trauma.    Past Medical History:  Diagnosis Date  . Arthritis   . Asthma   . Chronic kidney disease   . Depression   . Hypertension     Patient Active Problem List   Diagnosis Date Noted  . PVD (peripheral vascular disease) (HCC) 01/04/2016    Past Surgical History:  Procedure Laterality Date  . ABDOMINAL HYSTERECTOMY    . PERIPHERAL VASCULAR CATHETERIZATION Left 01/10/2016   Procedure: Lower Extremity Angiography;  Surgeon: Annice Needy, MD;  Location: [facility];  Service: Cardiovascular;  Laterality: Left;  . PERIPHERAL VASCULAR CATHETERIZATION  01/10/2016   Procedure: Lower Extremity Intervention;  Surgeon: Annice Needy, MD;  Location: [facility];  Service: Cardiovascular;;  . PERIPHERAL VASCULAR CATHETERIZATION Right 01/17/2016   Procedure: Lower Extremity Angiography;  Surgeon: Annice Needy, MD;  Location: [facility];  Service: Cardiovascular;  Laterality: Right;  . PERIPHERAL VASCULAR CATHETERIZATION  01/17/2016   Procedure: Lower Extremity Intervention;  Surgeon: Annice Needy, MD;  Location: [facility];  Service: Cardiovascular;;  . THYROID SURGERY      OB History    No data available       Home  Medications    Prior to Admission medications   Medication Sig Start Date End Date Taking? Authorizing Provider  albuterol (VENTOLIN HFA) 108 (90 Base) MCG/ACT inhaler Inhale 2 puffs into the lungs every 6 (six) hours as needed for wheezing or shortness of breath. FOR SEASONAL ALLERGIES   Yes Historical Provider, MD  aspirin EC 81 MG tablet Take 1 tablet (81 mg total) by mouth daily. 01/10/16  Yes Annice Needy, MD  atorvastatin (LIPITOR) 20 MG tablet Take 1 tablet (20 mg total) by mouth daily. 01/10/16  Yes Annice Needy, MD  clopidogrel (PLAVIX) 75 MG tablet Take 1 tablet (75 mg total) by mouth daily. 01/10/16  Yes Annice Needy, MD  escitalopram (LEXAPRO) 20 MG tablet Take 20 mg by mouth at bedtime. 03/16/16 03/16/17 Yes Historical Provider, MD  ferrous sulfate 325 (65 FE) MG tablet Take 325 mg by mouth at bedtime.  03/05/16 03/05/17 Yes Historical Provider, MD  fexofenadine (ALLEGRA) 180 MG tablet Take 180 mg by mouth daily. 08/04/16 08/04/17 Yes Historical Provider, MD  meclizine (ANTIVERT) 25 MG tablet Take 25 mg by mouth every 8 (eight) hours as needed for dizziness.  03/16/16  Yes Historical Provider, MD  metoprolol-hydrochlorothiazide (LOPRESSOR HCT) 50-25 MG tablet Take 1 tablet by mouth at bedtime. 08/15/16 03/03/17 Yes Historical Provider, MD  montelukast (SINGULAIR) 10 MG tablet Take 10 mg by mouth at bedtime.  11/24/14  Yes Historical Provider, MD  Propylene Glycol (SYSTANE BALANCE)  0.6 % SOLN Place 1-2 drops into both eyes 3 (three) times daily as needed (for itchiness or irritation).   Yes Historical Provider, MD  Vitamin D, Ergocalciferol, (DRISDOL) 50000 UNITS CAPS capsule Take 50,000 Units by mouth every 30 (thirty) days. Reported on 01/10/2016 11/04/14  Yes Historical Provider, MD  colchicine 0.6 MG tablet Take 1 tablet (0.6 mg total) by mouth daily. Patient not taking: Reported on 09/01/2016 12/11/14   Vivi BarrackMatthew R Wagoner, DPM  HYDROcodone-acetaminophen (NORCO/VICODIN) 5-325 MG tablet Take 1 tablet by  mouth every 8 (eight) hours as needed. Patient not taking: Reported on 09/01/2016 08/13/16   Tommie SamsJayce G Cook, DO    Family History No family history on file.  Social History Social History  Substance Use Topics  . Smoking status: Current Every Day Smoker    Packs/day: 0.50  . Smokeless tobacco: Never Used  . Alcohol use No     Allergies   Codeine and Symbicort [budesonide-formoterol fumarate]   Review of Systems Review of Systems  Constitutional: Negative for chills and fever.  HENT: Negative for ear pain and sore throat.   Eyes: Negative for pain and visual disturbance.  Respiratory: Negative for cough and shortness of breath.   Cardiovascular: Negative for chest pain and palpitations.  Gastrointestinal: Negative for abdominal pain and vomiting.  Genitourinary: Negative for dysuria and hematuria.  Musculoskeletal: Positive for arthralgias. Negative for back pain.  Skin: Negative for color change and rash.  Neurological: Positive for weakness and numbness (hx of neuropathy). Negative for seizures and syncope.  All other systems reviewed and are negative.    Physical Exam Updated Vital Signs BP 151/61   Pulse 62   Temp 97.3 F (36.3 C)   Resp 17   Ht 5\' 7"  (1.702 m)   Wt 83.5 kg   SpO2 100%   BMI 28.82 kg/m   Physical Exam  Constitutional: She is oriented to person, place, and time. She appears well-developed and well-nourished. No distress.  HENT:  Head: Normocephalic and atraumatic.  Eyes: Conjunctivae are normal.  Neck: Neck supple.  Cardiovascular: Normal rate and regular rhythm.   No murmur heard. Pulmonary/Chest: Effort normal and breath sounds normal. No respiratory distress.  Abdominal: Soft. There is no tenderness.  Musculoskeletal: She exhibits no edema.  Neurological: She is alert and oriented to person, place, and time. A sensory deficit is present. No cranial nerve deficit. She exhibits normal muscle tone. Gait abnormal. Coordination normal.    Patient has bearing areas of numbness, reported weakness, 5 out of 5 motor strength in all 4 extremities. Anterior thigh numbness as well as left forearm and hand numbness compared to opposite sides, however she is able to feel sharp.  Skin: Skin is warm and dry.  Psychiatric: She has a normal mood and affect.  Nursing note and vitals reviewed.    ED Treatments / Results  Labs (all labs ordered are listed, but only abnormal results are displayed) Labs Reviewed  CBC - Abnormal; Notable for the following:       Result Value   RBC 3.50 (*)    Hemoglobin 10.9 (*)    HCT 33.4 (*)    All other components within normal limits  COMPREHENSIVE METABOLIC PANEL - Abnormal; Notable for the following:    CO2 21 (*)    BUN 22 (*)    Creatinine, Ser 1.26 (*)    ALT 10 (*)    GFR calc non Af Amer 39 (*)    GFR calc Af Denyse DagoAmer  45 (*)    All other components within normal limits  I-STAT CHEM 8, ED - Abnormal; Notable for the following:    BUN 24 (*)    Creatinine, Ser 1.30 (*)    Calcium, Ion 1.00 (*)    Hemoglobin 11.6 (*)    HCT 34.0 (*)    All other components within normal limits  PROTIME-INR  APTT  DIFFERENTIAL  I-STAT TROPOININ, ED  CBG MONITORING, ED    EKG  EKG Interpretation None       Radiology Ct Head Wo Contrast  Result Date: 09/01/2016 CLINICAL DATA:  Bilateral leg weakness and numbness history of fall EXAM: CT HEAD WITHOUT CONTRAST TECHNIQUE: Contiguous axial images were obtained from the base of the skull through the vertex without intravenous contrast. COMPARISON:  MRI 07/15/2015 FINDINGS: Brain: No acute territorial infarction or intracranial hemorrhage is visualized. Moderate severe periventricular and deep white matter hypodensities. Hypodensities within the bilateral thalamus and pons. Findings would be consistent with small vessel disease. No focal mass or midline shift. Mild atrophy. Stable ventricular size. Vascular: No hyperdense vessels.  Carotid artery  calcifications. Skull: Mastoid air cells are clear.  No skull fracture is seen. Sinuses/Orbits: Mucosal thickening in the paranasal sinuses. No acute orbital abnormality. Other: Prominent degenerative changes at C1-C2 articulation. IMPRESSION: 1. No CT evidence for acute intracranial abnormality. 2. Moderate to marked periventricular and deep white matter hypodensities consistent with small vessel disease. Electronically Signed   By: Jasmine PangKim  Fujinaga M.D.   On: 09/01/2016 18:51   Dg Knee Complete 4 Views Left  Result Date: 09/01/2016 CLINICAL DATA:  Acute onset of left knee pain and swelling after injury. Initial encounter. EXAM: LEFT KNEE - COMPLETE 4+ VIEW COMPARISON:  Left knee radiographs performed 08/13/2016 FINDINGS: There is no evidence of fracture or dislocation. Marginal osteophytes are noted arising at the medial lateral compartments, and at the tibial spine. Wall osteophytes are also seen. There is mild narrowing of the patellofemoral compartment. Underlying chondrocalcinosis and calcification of the menisci are noted. A fabella is seen. Trace knee joint fluid remains within normal limits. A vascular stent is noted along the distal thigh. IMPRESSION: 1. No evidence of fracture or dislocation. 2. Mild tricompartmental osteoarthritis at the left knee. 3. Underlying chondrocalcinosis and meniscal calcification noted. Electronically Signed   By: Roanna RaiderJeffery  Chang M.D.   On: 09/01/2016 22:24    Procedures Procedures (including critical care time)  Medications Ordered in ED Medications - No data to display   Initial Impression / Assessment and Plan / ED Course  I have reviewed the triage vital signs and the nursing notes.  Pertinent labs & imaging results that were available during my care of the patient were reviewed by me and considered in my medical decision making (see chart for details).  Clinical Course    82 year old female comes today with left knee pain and right leg numbness. She  reports slight numbness is the entire leg however on palpation to light touch she has full sensation in the lower leg has area of anterior and lateral decreased sensation compared to the left. She has medial and lateral joint line tenderness on the right. X-rays are negative. This is related to a fall that happened over a week ago. She has some numbness in her left hand as well this is an old finding. She has no back pain no urinary symptoms. Patient is attempting to ambulate after films are negative however her leg on the right feels weak and buckled a  few times and had our nurse November showed a fall. She will need admission for physical therapy evaluation possible further workup of a numbness in her leg however due the crit is severe is no need for emergent intervention from a stroke standpoint she did have a negative CT of her head. She has intact pulses in all extremities. Vital signs stable time handoff care. For the remainder for care please see inpatient team notes.  Final Clinical Impressions(s) / ED Diagnoses   Final diagnoses:  Paresthesia  Fall, subsequent encounter    New Prescriptions New Prescriptions   No medications on file     Cherlynn Perches, MD 09/02/16 0006    Tilden Fossa, MD 09/04/16 [phone]

## 2016-09-01 NOTE — ED Notes (Signed)
Pt ambulated with walker.

## 2016-09-02 ENCOUNTER — Encounter (HOSPITAL_COMMUNITY): Payer: Self-pay | Admitting: Internal Medicine

## 2016-09-02 ENCOUNTER — Observation Stay (HOSPITAL_BASED_OUTPATIENT_CLINIC_OR_DEPARTMENT_OTHER): Payer: Medicare HMO

## 2016-09-02 DIAGNOSIS — I739 Peripheral vascular disease, unspecified: Secondary | ICD-10-CM

## 2016-09-02 DIAGNOSIS — M25561 Pain in right knee: Secondary | ICD-10-CM | POA: Diagnosis present

## 2016-09-02 DIAGNOSIS — R29898 Other symptoms and signs involving the musculoskeletal system: Secondary | ICD-10-CM | POA: Diagnosis present

## 2016-09-02 DIAGNOSIS — R531 Weakness: Secondary | ICD-10-CM | POA: Diagnosis not present

## 2016-09-02 LAB — VAS US LOWER EXTREMITY ARTERIAL DUPLEX
LATIBPSV: 63 cm/s
LEFT PERO DIST SYS: 31 cm/s
LEFT SFA PROX DYS VEL: 162 cm/s
LPEROMIDSYS: 36 cm/s
LPTIBDISTSYS: 6 cm/s
LSFDPSV: -28 cm/s
LSFMPSV: -24 cm/s
LSFPPSV: 473 cm/s
Left ant tibial distal sys: 28 cm/s
Left ant tibial mid sys: 30 cm/s
Left popliteal dist sys PSV: 70 cm/s
Left popliteal prox sys PSV: 79 cm/s
RATIBDISTSYS: -54 cm/s
RATIBMIDSYS: -18 cm/s
RIGHT PERO MID SYS: 61 cm/s
RIGHT POST TIB MID SYS: 77 cm/s
RPOPDPSV: 84 cm/s
RPOPPPSV: 70 cm/s
RSFPPSV: 155 cm/s
RTIBDISTSYS: 54 cm/s
Right post tibial sys PSV: 88 cm/s
Right super femoral dist sys PSV: -72 cm/s
Right super femoral mid sys PSV: -64 cm/s
left post tibial mid sys: 34 cm/s

## 2016-09-02 MED ORDER — ALBUTEROL SULFATE (2.5 MG/3ML) 0.083% IN NEBU: 2.5000 mg | INHALATION_SOLUTION | Freq: Four times a day (QID) | RESPIRATORY_TRACT | Status: DC | PRN

## 2016-09-02 MED ORDER — MONTELUKAST SODIUM 10 MG PO TABS
10.0000 mg | ORAL_TABLET | Freq: Every day | ORAL | Status: DC
  Administered 2016-09-02: 10 mg via ORAL
  Filled 2016-09-02: qty 1

## 2016-09-02 MED ORDER — HYDROCHLOROTHIAZIDE 25 MG PO TABS
25.0000 mg | ORAL_TABLET | Freq: Every day | ORAL | Status: DC
  Administered 2016-09-02: 25 mg via ORAL
  Filled 2016-09-02: qty 1

## 2016-09-02 MED ORDER — FERROUS SULFATE 325 (65 FE) MG PO TABS
325.0000 mg | ORAL_TABLET | Freq: Every day | ORAL | Status: DC
  Administered 2016-09-02: 325 mg via ORAL
  Filled 2016-09-02: qty 1

## 2016-09-02 MED ORDER — METOPROLOL-HYDROCHLOROTHIAZIDE 50-25 MG PO TABS: 1.0000 | ORAL_TABLET | Freq: Every day | ORAL | Status: DC

## 2016-09-02 MED ORDER — LORATADINE 10 MG PO TABS
10.0000 mg | ORAL_TABLET | Freq: Every day | ORAL | Status: DC
  Administered 2016-09-02 – 2016-09-03 (×2): 10 mg via ORAL
  Filled 2016-09-02 (×2): qty 1

## 2016-09-02 MED ORDER — MECLIZINE HCL 12.5 MG PO TABS: 25.0000 mg | ORAL_TABLET | Freq: Three times a day (TID) | ORAL | Status: DC | PRN

## 2016-09-02 MED ORDER — CLOPIDOGREL BISULFATE 75 MG PO TABS
75.0000 mg | ORAL_TABLET | Freq: Every day | ORAL | Status: DC
  Administered 2016-09-02 – 2016-09-03 (×2): 75 mg via ORAL
  Filled 2016-09-02 (×2): qty 1

## 2016-09-02 MED ORDER — ZOLPIDEM TARTRATE 5 MG PO TABS
ORAL_TABLET | ORAL | Status: AC
  Filled 2016-09-02: qty 1

## 2016-09-02 MED ORDER — ENOXAPARIN SODIUM 40 MG/0.4ML ~~LOC~~ SOLN
40.0000 mg | SUBCUTANEOUS | Status: DC
  Administered 2016-09-02 – 2016-09-03 (×2): 40 mg via SUBCUTANEOUS
  Filled 2016-09-02 (×2): qty 0.4

## 2016-09-02 MED ORDER — METOPROLOL TARTRATE 50 MG PO TABS
50.0000 mg | ORAL_TABLET | Freq: Every day | ORAL | Status: DC
  Administered 2016-09-02: 50 mg via ORAL
  Filled 2016-09-02: qty 1

## 2016-09-02 MED ORDER — ACETAMINOPHEN 325 MG PO TABS
650.0000 mg | ORAL_TABLET | Freq: Four times a day (QID) | ORAL | Status: DC | PRN
  Administered 2016-09-02 – 2016-09-03 (×2): 650 mg via ORAL
  Filled 2016-09-02 (×3): qty 2

## 2016-09-02 MED ORDER — ASPIRIN EC 81 MG PO TBEC
81.0000 mg | DELAYED_RELEASE_TABLET | Freq: Every day | ORAL | Status: DC
  Administered 2016-09-02 – 2016-09-03 (×2): 81 mg via ORAL
  Filled 2016-09-02 (×2): qty 1

## 2016-09-02 MED ORDER — ESCITALOPRAM OXALATE 10 MG PO TABS
20.0000 mg | ORAL_TABLET | Freq: Every day | ORAL | Status: DC
  Administered 2016-09-02: 20 mg via ORAL
  Filled 2016-09-02: qty 2

## 2016-09-02 MED ORDER — ZOLPIDEM TARTRATE 5 MG PO TABS
5.0000 mg | ORAL_TABLET | Freq: Once | ORAL | Status: AC
  Administered 2016-09-02: 5 mg via ORAL

## 2016-09-02 MED ORDER — ATORVASTATIN CALCIUM 10 MG PO TABS
20.0000 mg | ORAL_TABLET | Freq: Every day | ORAL | Status: DC
  Administered 2016-09-02: 20 mg via ORAL
  Filled 2016-09-02: qty 2

## 2016-09-02 NOTE — H&P (Signed)
History and Physical    Nancy Larsen:[id number] DOB: [birth year] DOA: 09/01/2016   PCP: Rayetta Humphrey, MD Chief Complaint:  Chief Complaint  Patient presents with  . Fall  . Numbness    HPI: Nancy Larsen is a 82 y.o. female with medical history significant of PAD s/p angioplasty and stenting to BLE in April of this year done at [facility].  Patient presents to the ED with 1 week history of difficulty with ambulation following a fall where she landed on her L knee.  She has 7/10 left knee pain, but states the bigger issue is BLE weakness since her knee "gave out" on her last week.  Prior to this she had been doing well.  She does have occasional left lower back pain for years but does not have any radiation of this pain down either leg.  She reports a small amount of numbness on left side which was diagnosed as neuropathy previously.  ED Course: CT head negative for stroke.  Imaging of L knee is negative.  Patient unsteady with gait.  On further questioning she admits that BLE claudication is improved since the angioplasty and stenting earlier this year but is still persistent and ongoing.  No urine or bowel incontinence or retention.  Review of Systems: As per HPI otherwise 10 point review of systems negative.    Past Medical History:  Diagnosis Date  . Arthritis   . Asthma   . Chronic kidney disease   . Depression   . Hypertension     Past Surgical History:  Procedure Laterality Date  . ABDOMINAL HYSTERECTOMY    . PERIPHERAL VASCULAR CATHETERIZATION Left 01/10/2016   Procedure: Lower Extremity Angiography;  Surgeon: Annice Needy, MD;  Location: [facility];  Service: Cardiovascular;  Laterality: Left;  . PERIPHERAL VASCULAR CATHETERIZATION  01/10/2016   Procedure: Lower Extremity Intervention;  Surgeon: Annice Needy, MD;  Location: [facility];  Service: Cardiovascular;;  . PERIPHERAL VASCULAR CATHETERIZATION Right 01/17/2016   Procedure: Lower Extremity  Angiography;  Surgeon: Annice Needy, MD;  Location: [facility];  Service: Cardiovascular;  Laterality: Right;  . PERIPHERAL VASCULAR CATHETERIZATION  01/17/2016   Procedure: Lower Extremity Intervention;  Surgeon: Annice Needy, MD;  Location: [facility];  Service: Cardiovascular;;  . THYROID SURGERY       reports that she has been smoking.  She has been smoking about 0.50 packs per day. She has never used smokeless tobacco. She reports that she does not drink alcohol or use drugs.  Allergies  Allergen Reactions  . Codeine Other (See Comments)    Dysuria  . Symbicort [Budesonide-Formoterol Fumarate] Other (See Comments)    Made patient jittery    Family History  Problem Relation Age of Onset  . CAD Mother   . Colon cancer Mother   . Heart disease Father       Prior to Admission medications   Medication Sig Start Date End Date Taking? Authorizing Provider  albuterol (VENTOLIN HFA) 108 (90 Base) MCG/ACT inhaler Inhale 2 puffs into the lungs every 6 (six) hours as needed for wheezing or shortness of breath. FOR SEASONAL ALLERGIES   Yes Historical Provider, MD  aspirin EC 81 MG tablet Take 1 tablet (81 mg total) by mouth daily. 01/10/16  Yes Annice Needy, MD  atorvastatin (LIPITOR) 20 MG tablet Take 1 tablet (20 mg total) by mouth daily. 01/10/16  Yes Annice Needy, MD  clopidogrel (  PLAVIX) 75 MG tablet Take 1 tablet (75 mg total) by mouth daily. 01/10/16  Yes Annice NeedyJason S Dew, MD  escitalopram (LEXAPRO) 20 MG tablet Take 20 mg by mouth at bedtime. 03/16/16 03/16/17 Yes Historical Provider, MD  ferrous sulfate 325 (65 FE) MG tablet Take 325 mg by mouth at bedtime.  03/05/16 03/05/17 Yes Historical Provider, MD  fexofenadine (ALLEGRA) 180 MG tablet Take 180 mg by mouth daily. 08/04/16 08/04/17 Yes Historical Provider, MD  meclizine (ANTIVERT) 25 MG tablet Take 25 mg by mouth every 8 (eight) hours as needed for dizziness.  03/16/16  Yes Historical Provider, MD  metoprolol-hydrochlorothiazide  (LOPRESSOR HCT) 50-25 MG tablet Take 1 tablet by mouth at bedtime. 08/15/16 03/03/17 Yes Historical Provider, MD  montelukast (SINGULAIR) 10 MG tablet Take 10 mg by mouth at bedtime.  11/24/14  Yes Historical Provider, MD  Propylene Glycol (SYSTANE BALANCE) 0.6 % SOLN Place 1-2 drops into both eyes 3 (three) times daily as needed (for itchiness or irritation).   Yes Historical Provider, MD  Vitamin D, Ergocalciferol, (DRISDOL) 50000 UNITS CAPS capsule Take 50,000 Units by mouth every 30 (thirty) days. Reported on 01/10/2016 11/04/14  Yes Historical Provider, MD  colchicine 0.6 MG tablet Take 1 tablet (0.6 mg total) by mouth daily. Patient not taking: Reported on 09/01/2016 12/11/14   Vivi BarrackMatthew R Wagoner, DPM  HYDROcodone-acetaminophen (NORCO/VICODIN) 5-325 MG tablet Take 1 tablet by mouth every 8 (eight) hours as needed. Patient not taking: Reported on 09/01/2016 08/13/16   Tommie SamsJayce G Cook, DO    Physical Exam: Vitals:   09/01/16 2200 09/01/16 2315 09/02/16 0000 09/02/16 0030  BP:  151/61 144/56 138/61  Pulse:  62 64 67  Resp:  17 15 16   Temp: 97.3 F (36.3 C)     TempSrc:      SpO2:  100% 100% 99%  Weight:      Height:          Constitutional: NAD, calm, comfortable Eyes: PERRL, lids and conjunctivae normal ENMT: Mucous membranes are moist. Posterior pharynx clear of any exudate or lesions.Normal dentition.  Neck: normal, supple, no masses, no thyromegaly Respiratory: clear to auscultation bilaterally, no wheezing, no crackles. Normal respiratory effort. No accessory muscle use.  Cardiovascular: Regular rate and rhythm, no murmurs / rubs / gallops. No extremity edema. 2+ pedal pulses. No carotid bruits.  Abdomen: no tenderness, no masses palpated. No hepatosplenomegaly. Bowel sounds positive.  Musculoskeletal: no clubbing / cyanosis. No joint deformity upper and lower extremities. Good ROM, no contractures. Normal muscle tone.  Skin: no rashes, lesions, ulcers. No induration Neurologic: CN 2-12  grossly intact. Sensation intact, DTR normal. Strength 5/5 in all 4.  Psychiatric: Normal judgment and insight. Alert and oriented x 3. Normal mood.    Labs on Admission: I have personally reviewed following labs and imaging studies  CBC:  Recent Labs Lab 09/01/16 1743 09/01/16 1757  WBC 4.7  --   NEUTROABS 3.2  --   HGB 10.9* 11.6*  HCT 33.4* 34.0*  MCV 95.4  --   PLT 207  --    Basic Metabolic Panel:  Recent Labs Lab 09/01/16 1743 09/01/16 1757  NA 137 137  K 3.9 4.2  CL 103 107  CO2 21*  --   GLUCOSE 94 92  BUN 22* 24*  CREATININE 1.26* 1.30*  CALCIUM 9.2  --    GFR: Estimated Creatinine Clearance: 38.4 mL/min (by C-G formula based on SCr of 1.3 mg/dL (H)). Liver Function Tests:  Recent Labs Lab  09/01/16 1743  AST 18  ALT 10*  ALKPHOS 97  BILITOT 0.6  PROT 7.2  ALBUMIN 3.8   No results for input(s): LIPASE, AMYLASE in the last 168 hours. No results for input(s): AMMONIA in the last 168 hours. Coagulation Profile:  Recent Labs Lab 09/01/16 1743  INR 1.07   Cardiac Enzymes: No results for input(s): CKTOTAL, CKMB, CKMBINDEX, TROPONINI in the last 168 hours. BNP (last 3 results) No results for input(s): PROBNP in the last 8760 hours. HbA1C: No results for input(s): HGBA1C in the last 72 hours. CBG:  Recent Labs Lab 09/01/16 1755  GLUCAP 98   Lipid Profile: No results for input(s): CHOL, HDL, LDLCALC, TRIG, CHOLHDL, LDLDIRECT in the last 72 hours. Thyroid Function Tests: No results for input(s): TSH, T4TOTAL, FREET4, T3FREE, THYROIDAB in the last 72 hours. Anemia Panel: No results for input(s): VITAMINB12, FOLATE, FERRITIN, TIBC, IRON, RETICCTPCT in the last 72 hours. Urine analysis: No results found for: COLORURINE, APPEARANCEUR, LABSPEC, PHURINE, GLUCOSEU, HGBUR, BILIRUBINUR, KETONESUR, PROTEINUR, UROBILINOGEN, NITRITE, LEUKOCYTESUR Sepsis Labs: @LABRCNTIP (procalcitonin:4,lacticidven:4) )No results found for this or any previous visit  (from the past 240 hour(s)).   Radiological Exams on Admission: Ct Head Wo Contrast  Result Date: 09/01/2016 CLINICAL DATA:  Bilateral leg weakness and numbness history of fall EXAM: CT HEAD WITHOUT CONTRAST TECHNIQUE: Contiguous axial images were obtained from the base of the skull through the vertex without intravenous contrast. COMPARISON:  MRI 07/15/2015 FINDINGS: Brain: No acute territorial infarction or intracranial hemorrhage is visualized. Moderate severe periventricular and deep white matter hypodensities. Hypodensities within the bilateral thalamus and pons. Findings would be consistent with small vessel disease. No focal mass or midline shift. Mild atrophy. Stable ventricular size. Vascular: No hyperdense vessels.  Carotid artery calcifications. Skull: Mastoid air cells are clear.  No skull fracture is seen. Sinuses/Orbits: Mucosal thickening in the paranasal sinuses. No acute orbital abnormality. Other: Prominent degenerative changes at C1-C2 articulation. IMPRESSION: 1. No CT evidence for acute intracranial abnormality. 2. Moderate to marked periventricular and deep white matter hypodensities consistent with small vessel disease. Electronically Signed   By: Jasmine Pang M.D.   On: 09/01/2016 18:51   Dg Knee Complete 4 Views Left  Result Date: 09/01/2016 CLINICAL DATA:  Acute onset of left knee pain and swelling after injury. Initial encounter. EXAM: LEFT KNEE - COMPLETE 4+ VIEW COMPARISON:  Left knee radiographs performed 08/13/2016 FINDINGS: There is no evidence of fracture or dislocation. Marginal osteophytes are noted arising at the medial lateral compartments, and at the tibial spine. Wall osteophytes are also seen. There is mild narrowing of the patellofemoral compartment. Underlying chondrocalcinosis and calcification of the menisci are noted. A fabella is seen. Trace knee joint fluid remains within normal limits. A vascular stent is noted along the distal thigh. IMPRESSION: 1. No  evidence of fracture or dislocation. 2. Mild tricompartmental osteoarthritis at the left knee. 3. Underlying chondrocalcinosis and meniscal calcification noted. Electronically Signed   By: Roanna Raider M.D.   On: 09/01/2016 22:24    EKG: Independently reviewed.  Assessment/Plan Principal Problem:   Bilateral leg weakness Active Problems:   PVD (peripheral vascular disease) (HCC)   Knee pain, right    1. BLE weakness - 1. Will obtain arterial US of BLE given the history of B PAD stenting earlier this year and suggestion of ongoing claudication despite this on history. 2. PT/OT 3. Radiculopathy doesn't seem to fit 4. May have underlying peripheral neuropathy 5. CT head negative for stroke despite 1 wk+ of  symptoms 6. Symptoms are not at all c/w cauda equina syndrome.   DVT prophylaxis: Lovenox Code Status: Full Family Communication: Family at bedside Consults called: None Admission status: Admit to obs   Bryan Omura, Heywood IlesJARED M. DO [facility] Pager [phone] from 7PM-7AM  If 7AM-7PM, please contact the day physician for the patient [email or web address] Password [email or web address]  09/02/2016, 1:32 AM

## 2016-09-02 NOTE — Progress Notes (Addendum)
*  PRELIMINARY RESULTS* Vascular Ultrasound ABI completed:    RIGHT    LEFT    PRESSURE WAVEFORM  PRESSURE WAVEFORM  BRACHIAL 124 Triphasic BRACHIAL 127 Triphasic  DP 85 Monophasic DP 56 Severely dampened monophasic  AT   AT    PT 89 Monophasic PT 66 Severely dampened monophasic  PER   PER    GREAT TOE  NA GREAT TOE  NA    RIGHT LEFT  ABI 0.70 0.52   The right ABI is suggestive of moderate arterial insufficiency at rest, the left ABI is suggestive of moderate, borderline severe, arterial insufficiency at rest.    Bilateral Lower Extremity Arterial Duplex has been completed.  The right lower extremity exhibits no obvious evidence of focal hemodynamically significant stenosis. The right external iliac artery exhibits monophasic flow, suggestive of possible aortoiliac disease.   The left external iliac artery exhibits dampened monophasic flow, suggestive of probable aortoiliac disease. The left proximal femoral artery exhibits focally elevated velocities (514/130 cm/s) suggestive of 75-99% stenosis.  Incidental finding: there is a heterogenous area of the right groin measuring 3.0cm, suggestive of possible prominent inguinal lymph node.  09/02/2016 11:59 AM Gertie FeyMichelle Darlisha Kelm, BS, RVT, RDCS, RDMS

## 2016-09-02 NOTE — Discharge Summary (Signed)
Physician Discharge Summary  DAVETTE NUGENT ZOX:[id number] DOB: 03/26/1934 DOA: 09/01/2016  PCP: Rayetta Humphrey, MD  Admit date: 09/01/2016 Discharge date: 09/03/2016  Admitted From: Home Disposition:  Home  Recommendations for Outpatient Follow-up:  1. Follow up with PCP in 1-2 weeks 2. Follow up with vascular surgery, per Dr. Randie Heinz 3. Please schedule a follow up appointment with [facility] when they contact you 4. Please obtain BMP/CBC in one week 5. Home Health PT/OT/RN ordered  Home Health: No Equipment/Devices: None  Discharge Condition: Stable CODE STATUS: Full code Diet recommendation: Heart Healthy  Brief/Interim Summary:  Nancy Larsen is a 82 y.o. female with medical history significant of PAD s/p angioplasty and stenting to BLE in April of this year done at [facility].  Patient presents to the ED with 1 week history of difficulty with ambulation following a fall where she landed on her L knee.  She has 7/10 left knee pain, but states the bigger issue is BLE weakness since her knee "gave out" on her last week.  Prior to this she had been doing well.  She does have occasional left lower back pain for years but does not have any radiation of this pain down either leg.  She reports a small amount of numbness on left side which was diagnosed as neuropathy previously.  CT scan of head was negative.  [place] of lower extremity showed some decreased blood flow and she was seen by vascular surgery who encouraged her to follow up in the office for evaluation and follow up imaging.  Telemetry strip showed questionable irregular heartbeat but patient denied any symptoms.  She was open to following up with cardiology outpatient but stated she did not want to stay in the hospital for any additional workup.  She denied any syncopal episodes and says her weakness is purely due to her knee giving out.  She takes aspirin daily and then takes [facility] powder which contains aspirin  regularly.  Discussed that patient is a fall risk secondary to her leg weakness and that with such a risk increasing blood thinning agents from what she is currently on could be dangerous in terms of bleeding risk.  Patient states she will discuss this with her doctor and possibly the cardiologist when she makes her appointment.    Discharge Diagnoses:  Principal Problem:   Bilateral leg weakness Active Problems:   PVD (peripheral vascular disease) (HCC)   Knee pain, right    Discharge Instructions  Discharge Instructions    Call MD for:    Complete by:  As directed    Palpitations, weakness   Call MD for:  difficulty breathing, headache or visual disturbances    Complete by:  As directed    Call MD for:  extreme fatigue    Complete by:  As directed    Call MD for:  persistant dizziness or light-headedness    Complete by:  As directed    Call MD for:  persistant nausea and vomiting    Complete by:  As directed    Call MD for:  severe uncontrolled pain    Complete by:  As directed    Call MD for:  temperature >100.4    Complete by:  As directed    Diet - low sodium heart healthy    Complete by:  As directed    Increase activity slowly    Complete by:  As directed    Increase activity slowly  Complete by:  As directed        Medication List    TAKE these medications   aspirin EC 81 MG tablet Take 1 tablet (81 mg total) by mouth daily.   atorvastatin 20 MG tablet Commonly known as:  LIPITOR Take 1 tablet (20 mg total) by mouth daily.   clopidogrel 75 MG tablet Commonly known as:  PLAVIX Take 1 tablet (75 mg total) by mouth daily.   escitalopram 20 MG tablet Commonly known as:  LEXAPRO Take 20 mg by mouth at bedtime.   ferrous sulfate 325 (65 FE) MG tablet Take 325 mg by mouth at bedtime.   fexofenadine 180 MG tablet Commonly known as:  ALLEGRA Take 180 mg by mouth daily.   meclizine 25 MG tablet Commonly known as:  ANTIVERT Take 25 mg by mouth every 8  (eight) hours as needed for dizziness.   metoprolol-hydrochlorothiazide 50-25 MG tablet Commonly known as:  LOPRESSOR HCT Take 1 tablet by mouth at bedtime.   montelukast 10 MG tablet Commonly known as:  SINGULAIR Take 10 mg by mouth at bedtime.   SYSTANE BALANCE 0.6 % Soln Generic drug:  Propylene Glycol Place 1-2 drops into both eyes 3 (three) times daily as needed (for itchiness or irritation).   VENTOLIN HFA 108 (90 Base) MCG/ACT inhaler Generic drug:  albuterol Inhale 2 puffs into the lungs every 6 (six) hours as needed for wheezing or shortness of breath. FOR SEASONAL ALLERGIES   Vitamin D (Ergocalciferol) 50000 units Caps capsule Commonly known as:  DRISDOL Take 50,000 Units by mouth every 30 (thirty) days. Reported on 01/10/2016       Allergies  Allergen Reactions  . Codeine Other (See Comments)    Dysuria  . Symbicort [Budesonide-Formoterol Fumarate] Other (See Comments)    Made patient jittery    Consultations:  Vascular  PT/OT   Procedures/Studies: Ct Head Wo Contrast  Result Date: 09/01/2016 CLINICAL DATA:  Bilateral leg weakness and numbness history of fall EXAM: CT HEAD WITHOUT CONTRAST TECHNIQUE: Contiguous axial images were obtained from the base of the skull through the vertex without intravenous contrast. COMPARISON:  MRI 07/15/2015 FINDINGS: Brain: No acute territorial infarction or intracranial hemorrhage is visualized. Moderate severe periventricular and deep white matter hypodensities. Hypodensities within the bilateral thalamus and pons. Findings would be consistent with small vessel disease. No focal mass or midline shift. Mild atrophy. Stable ventricular size. Vascular: No hyperdense vessels.  Carotid artery calcifications. Skull: Mastoid air cells are clear.  No skull fracture is seen. Sinuses/Orbits: Mucosal thickening in the paranasal sinuses. No acute orbital abnormality. Other: Prominent degenerative changes at C1-C2 articulation. IMPRESSION: 1.  No CT evidence for acute intracranial abnormality. 2. Moderate to marked periventricular and deep white matter hypodensities consistent with small vessel disease. Electronically Signed   By: Jasmine PangKim  Fujinaga M.D.   On: 09/01/2016 18:51   Dg Knee Complete 4 Views Left  Result Date: 09/01/2016 CLINICAL DATA:  Acute onset of left knee pain and swelling after injury. Initial encounter. EXAM: LEFT KNEE - COMPLETE 4+ VIEW COMPARISON:  Left knee radiographs performed 08/13/2016 FINDINGS: There is no evidence of fracture or dislocation. Marginal osteophytes are noted arising at the medial lateral compartments, and at the tibial spine. Wall osteophytes are also seen. There is mild narrowing of the patellofemoral compartment. Underlying chondrocalcinosis and calcification of the menisci are noted. A fabella is seen. Trace knee joint fluid remains within normal limits. A vascular stent is noted along the distal thigh. IMPRESSION: 1.  No evidence of fracture or dislocation. 2. Mild tricompartmental osteoarthritis at the left knee. 3. Underlying chondrocalcinosis and meniscal calcification noted. Electronically Signed   By: Roanna Raider M.D.   On: 09/01/2016 22:24   Dg Knee Complete 4 Views Left  Result Date: 08/13/2016 CLINICAL DATA:  Fall onto knee. EXAM: LEFT KNEE - COMPLETE 4+ VIEW COMPARISON:  None. FINDINGS: Small suprapatellar joint effusion. Moderate tricompartment osteoarthritis is noted. There is chondrocalcinosis. No fracture or subluxation. IMPRESSION: 1. Moderate tricompartment osteoarthritis and chondrocalcinosis. 2. Small joint effusion. Electronically Signed   By: Signa Kell M.D.   On: 08/13/2016 15:50     Telemetry strip: sinus rhythm with a question of possible atrial flutter that lasted 5 seconds overnight but no symptoms and no other episodes noted  Subjective: Patient seen and evaluated.  She is doing well and denies any weakness.  She has ambulated to the bathroom and to the chair.  She was  seen by vascular surgery last night and will follow up with them outpatient.  Telemetry strip showed one episode overnight of possibly atrial flutter but patient was asymptomatic and she states she has never had palpitations or told she had an irregular heartbeat prior to this.  She is open to following up with Cardiology but expresses she just wants to discharge home to her family today.  She denies any syncopal episodes.  Discussed that patient should follow up with Cardiology outpatient and that I would call them to make an appointment with her after discharge.  She is agreeable to this.  Discussed that with her history of recent fall and her on aspirin and plavix she may need to discuss with cardiology and/or her PCP if she has a truly irregular heartbeat needing a more aggressive blood thinner.  She does not want to pursue any additional blood thinning agents at this time and just wanted to discharge from the hospital.  She voiced understanding of our conversation and said she would be open with establishing care with a cardiologist in [place].  Encouraged her to return with any weakness, palpitations or syncopal episode.  Discharge Exam: Vitals:   09/03/16 0711 09/03/16 0904  BP: (!) 149/57 114/74  Pulse: 71   Resp: 18 18  Temp: 97.9 F (36.6 C) 98.1 F (36.7 C)   Vitals:   09/02/16 2054 09/03/16 0048 09/03/16 0711 09/03/16 0904  BP: 132/60 (!) 127/41 (!) 149/57 114/74  Pulse: 79 66 71   Resp: 18 16 18 18   Temp: 98.1 F (36.7 C) 98.7 F (37.1 C) 97.9 F (36.6 C) 98.1 F (36.7 C)  TempSrc: Oral Oral Oral Oral  SpO2: 99% 99% 100% 100%  Weight:      Height:        General: Pt is alert, awake, not in acute distress Cardiovascular: RRR, S1/S2 +, no rubs, no gallops Respiratory: CTA bilaterally, no wheezing, no rhonchi Abdominal: Soft, NT, ND, bowel sounds + Extremities: no edema, no cyanosis, no appreciable weakness of the lower or upper extremities    The results of  significant diagnostics from this hospitalization (including imaging, microbiology, ancillary and laboratory) are listed below for reference.     Microbiology: No results found for this or any previous visit (from the past 240 hour(s)).   Labs: BNP (last 3 results) No results for input(s): BNP in the last 8760 hours. Basic Metabolic Panel:  Recent Labs Lab 09/01/16 1743 09/01/16 1757  NA 137 137  K 3.9 4.2  CL 103 107  CO2  21*  --   GLUCOSE 94 92  BUN 22* 24*  CREATININE 1.26* 1.30*  CALCIUM 9.2  --    Liver Function Tests:  Recent Labs Lab 09/01/16 1743  AST 18  ALT 10*  ALKPHOS 97  BILITOT 0.6  PROT 7.2  ALBUMIN 3.8   No results for input(s): LIPASE, AMYLASE in the last 168 hours. No results for input(s): AMMONIA in the last 168 hours. CBC:  Recent Labs Lab 09/01/16 1743 09/01/16 1757  WBC 4.7  --   NEUTROABS 3.2  --   HGB 10.9* 11.6*  HCT 33.4* 34.0*  MCV 95.4  --   PLT 207  --    Cardiac Enzymes: No results for input(s): CKTOTAL, CKMB, CKMBINDEX, TROPONINI in the last 168 hours. BNP: Invalid input(s): POCBNP CBG:  Recent Labs Lab 09/01/16 1755  GLUCAP 98   D-Dimer No results for input(s): DDIMER in the last 72 hours. Hgb A1c No results for input(s): HGBA1C in the last 72 hours. Lipid Profile No results for input(s): CHOL, HDL, LDLCALC, TRIG, CHOLHDL, LDLDIRECT in the last 72 hours. Thyroid function studies No results for input(s): TSH, T4TOTAL, T3FREE, THYROIDAB in the last 72 hours.  Invalid input(s): FREET3 Anemia work up No results for input(s): VITAMINB12, FOLATE, FERRITIN, TIBC, IRON, RETICCTPCT in the last 72 hours. Urinalysis No results found for: COLORURINE, APPEARANCEUR, LABSPEC, PHURINE, GLUCOSEU, HGBUR, BILIRUBINUR, KETONESUR, PROTEINUR, UROBILINOGEN, NITRITE, LEUKOCYTESUR Sepsis Labs Invalid input(s): PROCALCITONIN,  WBC,  LACTICIDVEN Microbiology No results found for this or any previous visit (from the past 240  hour(s)).   Time coordinating discharge: Over 30 minutes  SIGNED:   Katrinka BlazingAlex U Kadolph, MD  [facility] 09/03/2016, 10:55 AM Pager [phone] If 7PM-7AM, please contact night-coverage [email or web address] Password [email or web address]

## 2016-09-02 NOTE — Progress Notes (Signed)
Patient asked pain level, patient had stated her "headache was gone".  Family was in room at bedside.  One hour later this nurse was notified patient was having a headache. This nurse brought her some tylenol within a ten minute time frame.  Patient stated "you took too long, I already took my BC powder", "now my headache is gone".  Patient and family educated on importance of following medication regime as ordered by physician under hospital care. Patient did not agree nor disagreed.  No noted signs of discomfort or distress. Will continue to monitor.

## 2016-09-02 NOTE — Evaluation (Addendum)
Physical Therapy Evaluation Patient Details Name: Nancy DollyDelores R Larsen MRN: [id number] DOB: 1933/11/29 Today's Date: 09/02/2016   History of Present Illness  Patient is an 82 yo female admitted 09/01/16 with BLE weakness.  Patient had fall 1 week pta with pain in Lt knee.  Continues to have gait disturbance.   PMH:  PAD, angioplasty and stents BLE's, arthritis, asthma, CKD, depression, HTN.  Clinical Impression  Patient presents with problems listed below.  Will benefit from acute PT to maximize functional mobility prior to d/c home with family.  Encouraged patient to complete her exercise 2x/day.  Recommend HHPT at d/c for continued therapy for strengthening, mobility, and gait training.    Follow Up Recommendations Home health PT;Supervision/Assistance - 24 hour    Equipment Recommendations  None recommended by PT    Recommendations for Other Services       Precautions / Restrictions Precautions Precautions: Fall Precaution Comments: Falls at home Restrictions Weight Bearing Restrictions: No      Mobility  Bed Mobility Overal bed mobility: Needs Assistance Bed Mobility: Supine to Sit     Supine to sit: Mod assist;HOB elevated     General bed mobility comments: Verbal cues to try to roll and move from side to sitting.  Patient moved LE's off of bed and reached for PT to pull up.  Cues for patient to push up on bed with UE's to move to sitting.  Required mod assist to bring trunk upright.  Patient reports she felt dizzy in sitting.  Described as lightheaded, off balance, and spinning.  BP was 117/54.  Transfers Overall transfer level: Needs assistance Equipment used: Rolling walker (2 wheeled) Transfers: Sit to/from Stand Sit to Stand: Min assist;+2 safety/equipment         General transfer comment: Verbal cues for hand placement.  Assist to rise to standing.  Patient reports dizziness in standing, and returned to sitting.  After seated rest and doing ankle pumps, patient  again returned to standing.   Ambulation/Gait Ambulation/Gait assistance: Min assist;+2 safety/equipment Ambulation Distance (Feet): 10 Feet Assistive device: Rolling walker (2 wheeled) Gait Pattern/deviations: Step-through pattern;Decreased step length - right;Decreased step length - left;Decreased stride length;Shuffle;Trunk flexed Gait velocity: decreased Gait velocity interpretation: Below normal speed for age/gender General Gait Details: Verbal cues for safe use of RW.  Patient able to ambulate 10'.  Reports feeling dizzy and LE weakness, and needed to sit down.  Chair behind patient and assist to control descent into chair.  Stairs            Wheelchair Mobility    Modified Rankin (Stroke Patients Only)       Balance Overall balance assessment: Needs assistance;History of Falls         Standing balance support: Bilateral upper extremity supported Standing balance-Leahy Scale: Poor                               Pertinent Vitals/Pain Pain Assessment: 0-10 Pain Score: 4  Pain Location: Head Pain Descriptors / Indicators: Headache Pain Intervention(s): Monitored during session;Patient requesting pain meds-RN notified    Home Living Family/patient expects to be discharged to:: Private residence Living Arrangements: Other relatives (2 grandsons) Available Help at Discharge: Family;Available 24 hours/day (grandsons, son) Type of Home: House Home Access: Stairs to enter Entrance Stairs-Rails: None Entrance Stairs-Number of Steps: 3 Home Layout: One level Home Equipment: Environmental consultantWalker - 2 wheels;Shower seat;Cane - single point Additional Comments: Uses desk chair  with arms to roll around house.  Grandson rolls patient in this chair.    Prior Function Level of Independence: Independent with assistive device(s);Needs assistance   Gait / Transfers Assistance Needed: Uses RW for gait.  Inside home, grandson pushes patient in a desk chair with arms.  Patient  reports she also propels herself in this chair.  ADL's / Homemaking Assistance Needed: Family assists with meal prep and housekeeping.  Patient reports she is still driving.        Hand Dominance        Extremity/Trunk Assessment   Upper Extremity Assessment: Defer to OT evaluation           Lower Extremity Assessment: Generalized weakness;RLE deficits/detail;LLE deficits/detail RLE Deficits / Details: Strength grossly 4-/5 LLE Deficits / Details: Strength grossly 3/5     Communication   Communication: No difficulties  Cognition Arousal/Alertness: Awake/alert Behavior During Therapy: WFL for tasks assessed/performed;Flat affect Overall Cognitive Status: Within Functional Limits for tasks assessed                      General Comments      Exercises General Exercises - Lower Extremity Quad Sets: AROM;Both;10 reps;Seated   Assessment/Plan    PT Assessment Patient needs continued PT services  PT Problem List Decreased strength;Decreased activity tolerance;Decreased balance;Decreased mobility;Decreased knowledge of use of DME;Obesity;Pain          PT Treatment Interventions DME instruction;Gait training;Stair training;Functional mobility training;Therapeutic activities;Therapeutic exercise;Balance training;Patient/family education    PT Goals (Current goals can be found in the Care Plan section)  Acute Rehab PT Goals Patient Stated Goal: To go home (now) PT Goal Formulation: With patient/family Time For Goal Achievement: 09/09/16 Potential to Achieve Goals: Good    Frequency Min 3X/week   Barriers to discharge        Co-evaluation               End of Session Equipment Utilized During Treatment: Gait belt Activity Tolerance: Patient limited by fatigue Patient left: in chair;with call bell/phone within reach;with chair alarm set;with family/visitor present Nurse Communication: Mobility status;Patient requests pain meds    Functional  Assessment Tool Used: Clinical judgement Functional Limitation: Mobility: Walking and moving around Mobility: Walking and Moving Around Current Status [phone]([id number]): At least 20 percent but less than 40 percent impaired, limited or restricted Mobility: Walking and Moving Around Goal Status [phone]([id number]): At least 1 percent but less than 20 percent impaired, limited or restricted    Time: [id number] PT Time Calculation (min) (ACUTE ONLY): 25 min   Charges:   PT Evaluation $PT Eval Moderate Complexity: 1 Procedure PT Treatments $Gait Training: 8-22 mins   PT G Codes:   PT G-Codes **NOT FOR INPATIENT CLASS** Functional Assessment Tool Used: Clinical judgement Functional Limitation: Mobility: Walking and moving around Mobility: Walking and Moving Around Current Status ([phone]): At least 20 percent but less than 40 percent impaired, limited or restricted Mobility: Walking and Moving Around Goal Status [phone]([id number]): At least 1 percent but less than 20 percent impaired, limited or restricted    Vena AustriaSusan H Wallace Cogliano 09/02/2016, 4:59 PM Durenda HurtSusan H. Renaldo Fiddleravis, PT, [facility] HospitalMBA Acute Rehab Services Pager [phone]

## 2016-09-02 NOTE — Consult Note (Signed)
Hospital Consult    Reason for Consult:  Leg weakness Referring Physician:  [facility] MRN #:  [id number]  History of Present Illness: This is a 82 y.o. female with history of bilateral tibial pta and sfa stenting. Now presents today with bilateral leg weakness. Specifically she had a fall 10 days ago and now has pain in her left knee and lower leg and foot. She denies any reset pain prior to her fall and is ambulatory without claudication symptoms. She is not sure if her previous vascular intervention actually helped her or not. She states that with rest her legs are feeling better than they have and she only wants to go home. Denies any ulceration or tissue loss. Takes asa/plavix and statin but does continue to smoke for the past 50 years.   Past Medical History:  Diagnosis Date  . Arthritis   . Asthma   . Chronic kidney disease   . Depression   . Hypertension     Past Surgical History:  Procedure Laterality Date  . ABDOMINAL HYSTERECTOMY    . PERIPHERAL VASCULAR CATHETERIZATION Left 01/10/2016   Procedure: Lower Extremity Angiography;  Surgeon: Annice NeedyJason S Dew, MD;  Location: [facility] INVASIVE CV LAB;  Service: Cardiovascular;  Laterality: Left;  . PERIPHERAL VASCULAR CATHETERIZATION  01/10/2016   Procedure: Lower Extremity Intervention;  Surgeon: Annice NeedyJason S Dew, MD;  Location: [facility] INVASIVE CV LAB;  Service: Cardiovascular;;  . PERIPHERAL VASCULAR CATHETERIZATION Right 01/17/2016   Procedure: Lower Extremity Angiography;  Surgeon: Annice NeedyJason S Dew, MD;  Location: [facility] INVASIVE CV LAB;  Service: Cardiovascular;  Laterality: Right;  . PERIPHERAL VASCULAR CATHETERIZATION  01/17/2016   Procedure: Lower Extremity Intervention;  Surgeon: Annice NeedyJason S Dew, MD;  Location: [facility] INVASIVE CV LAB;  Service: Cardiovascular;;  . THYROID SURGERY      Allergies  Allergen Reactions  . Codeine Other (See Comments)    Dysuria  . Symbicort [Budesonide-Formoterol Fumarate] Other (See Comments)    Made patient  jittery    Prior to Admission medications   Medication Sig Start Date End Date Taking? Authorizing Provider  albuterol (VENTOLIN HFA) 108 (90 Base) MCG/ACT inhaler Inhale 2 puffs into the lungs every 6 (six) hours as needed for wheezing or shortness of breath. FOR SEASONAL ALLERGIES   Yes Historical Provider, MD  aspirin EC 81 MG tablet Take 1 tablet (81 mg total) by mouth daily. 01/10/16  Yes Annice NeedyJason S Dew, MD  atorvastatin (LIPITOR) 20 MG tablet Take 1 tablet (20 mg total) by mouth daily. 01/10/16  Yes Annice NeedyJason S Dew, MD  clopidogrel (PLAVIX) 75 MG tablet Take 1 tablet (75 mg total) by mouth daily. 01/10/16  Yes Annice NeedyJason S Dew, MD  escitalopram (LEXAPRO) 20 MG tablet Take 20 mg by mouth at bedtime. 03/16/16 03/16/17 Yes Historical Provider, MD  ferrous sulfate 325 (65 FE) MG tablet Take 325 mg by mouth at bedtime.  03/05/16 03/05/17 Yes Historical Provider, MD  fexofenadine (ALLEGRA) 180 MG tablet Take 180 mg by mouth daily. 08/04/16 08/04/17 Yes Historical Provider, MD  meclizine (ANTIVERT) 25 MG tablet Take 25 mg by mouth every 8 (eight) hours as needed for dizziness.  03/16/16  Yes Historical Provider, MD  metoprolol-hydrochlorothiazide (LOPRESSOR HCT) 50-25 MG tablet Take 1 tablet by mouth at bedtime. 08/15/16 03/03/17 Yes Historical Provider, MD  montelukast (SINGULAIR) 10 MG tablet Take 10 mg by mouth at bedtime.  11/24/14  Yes Historical Provider, MD  Propylene Glycol (SYSTANE BALANCE) 0.6 % SOLN Place 1-2 drops into both eyes 3 (three)  times daily as needed (for itchiness or irritation).   Yes Historical Provider, MD  Vitamin D, Ergocalciferol, (DRISDOL) 50000 UNITS CAPS capsule Take 50,000 Units by mouth every 30 (thirty) days. Reported on 01/10/2016 11/04/14  Yes Historical Provider, MD    Social History   Social History  . Marital status: Widowed    Spouse name: N/A  . Number of children: N/A  . Years of education: N/A   Occupational History  . Not on file.   Social History Main Topics  . Smoking  status: Current Every Day Smoker    Packs/day: 0.50  . Smokeless tobacco: Never Used  . Alcohol use No  . Drug use: No  . Sexual activity: Not on file   Other Topics Concern  . Not on file   Social History Narrative  . No narrative on file     Family History  Problem Relation Age of Onset  . CAD Mother   . Colon cancer Mother   . Heart disease Father     ROS: [x]  Positive   [ ]  Negative   [ ]  All sytems reviewed and are negative  Cardiovascular: []  chest pain/pressure []  palpitations []  SOB lying flat []  DOE []  pain in legs while walking [x]  pain in legs at rest []  pain in legs at night []  non-healing ulcers []  hx of DVT []  swelling in legs  Pulmonary: []  productive cough []  asthma/wheezing []  home O2  Neurologic: []  weakness in []  arms []  legs []  numbness in []  arms []  legs []  hx of CVA []  mini stroke [] difficulty speaking or slurred speech []  temporary loss of vision in one eye []  dizziness  Hematologic: []  hx of cancer []  bleeding problems []  problems with blood clotting easily  Endocrine:   []  diabetes []  thyroid disease  GI []  vomiting blood []  blood in stool  GU: []  CKD/renal failure []  HD--[]  M/W/F or []  T/T/S []  burning with urination []  blood in urine  Psychiatric: []  anxiety []  depression  Musculoskeletal: []  arthritis []  joint pain  Integumentary: []  rashes []  ulcers  Constitutional: []  fever []  chills   Physical Examination  Vitals:   09/02/16 0504 09/02/16 0956  BP: 127/60 (!) (P) 122/40  Pulse: 75   Resp: 20 (P) 16  Temp: 98.7 F (37.1 C) (P) 98.5 F (36.9 C)   Body mass index is 27.14 kg/m.  General:  WDWN in NAD Gait: Not observed HENT: WNL, normocephalic Pulmonary: normal non-labored breathing, without Rales, rhonchi,  wheezing Cardiac: 1+ femoral pulses, monophasic dp/pt bilaterally Abdomen: soft, NT/ND, no masses Extremities: no ulcers Musculoskeletal: no muscle wasting or atrophy  Neurologic:  A&O X 3; Appropriate Affect ; SENSATION: normal; MOTOR FUNCTION:  moving all extremities equally. Speech is fluent/normal  CBC    Component Value Date/Time   WBC 4.7 09/01/2016 1743   RBC 3.50 (L) 09/01/2016 1743   HGB 11.6 (L) 09/01/2016 1757   HCT 34.0 (L) 09/01/2016 1757   HCT 31.8 (L) 11/11/2015 1505   PLT 207 09/01/2016 1743   PLT 199 11/11/2015 1505   MCV 95.4 09/01/2016 1743   MCV 95 11/11/2015 1505   MCH 31.1 09/01/2016 1743   MCHC 32.6 09/01/2016 1743   RDW 13.1 09/01/2016 1743   RDW 13.8 11/11/2015 1505   LYMPHSABS 1.0 09/01/2016 1743   LYMPHSABS 1.4 11/11/2015 1505   MONOABS 0.3 09/01/2016 1743   EOSABS 0.1 09/01/2016 1743   EOSABS 0.1 11/11/2015 1505   BASOSABS 0.0 09/01/2016 1743  BASOSABS 0.0 11/11/2015 1505    BMET    Component Value Date/Time   NA 137 09/01/2016 1757   NA 142 11/11/2015 1505   K 4.2 09/01/2016 1757   CL 107 09/01/2016 1757   CO2 21 (L) 09/01/2016 1743   GLUCOSE 92 09/01/2016 1757   BUN 24 (H) 09/01/2016 1757   BUN 19 11/11/2015 1505   CREATININE 1.30 (H) 09/01/2016 1757   CALCIUM 9.2 09/01/2016 1743   GFRNONAA 39 (L) 09/01/2016 1743   GFRAA 45 (L) 09/01/2016 1743    COAGS: Lab Results  Component Value Date   INR 1.07 09/01/2016     Non-Invasive Vascular Imaging:   ------------------------------------------------------------------- Summary: The right lower extremity exhibits no obvious evidence of focal hemodynamically significant stenosis. The right external iliac artery exhibits monophasic flow, suggestive of possible aortoiliac disease.  The left external iliac artery exhibits dampened monophasic flow, suggestive of probable aortoiliac disease. The left proximal femoral artery exhibits focally elevated velocities (514/130 cm/s) suggestive of 75-99% stenosis.  !Location        !Pressure!Brachial    !Waveform                 ! !                !        !index       !                          ! +----------------+--------+------------+-------------------------+ !Right ant tibial!85 mm Hg!0.67        !Monophasic               ! +----------------+--------+------------+-------------------------+ !Right post      !89 mm Hg!0.7         !Monophasic               ! !tibial          !        !            !                         ! +----------------+--------+------------+-------------------------+ !Left ant tibial !56 mm Hg!0.44        !Severely dampened        ! !                !        !            !monophasic               ! +----------------+--------+------------+-------------------------+ !Left post tibial!66 mm Hg!0.52        !Severely dampened        ! !                !        !            !monophasic               ! +----------------+--------+------------+-------------------------+   ASSESSMENT/PLAN: This is a 82 y.o. female with known pad s/p bilateral sfa stenting last April. I have reviewed thes pictures but they are still images of the aorta and iliac segments. She appears to have multifactorial leg pain including a low impact trauma to her left leg and likely inflow disease based on duplex today. I have discussed with her that we could  pursue further angiogram on non-urgent basis and she wants to go home at this time. Urged smoking cessation but she is not interested. Will have f/u in a month or so with CT angio of abdomen and pelvis with ble runoff.   Roderic Lammert C. Randie Heinz, MD [facility] [place] Office: [phone] Pager: [phone]

## 2016-09-02 NOTE — Progress Notes (Signed)
PROGRESS NOTE    Nancy Larsen  JXB:[id number] DOB: 30-Oct-1933 DOA: 09/01/2016 PCP: Rayetta Humphrey, MD   Brief Narrative:  Nancy Larsen is a 82 y.o. female with medical history significant of PAD s/p angioplasty and stenting to BLE in April of this year done at [facility].  Patient presents to the ED with 1 week history of difficulty with ambulation following a fall where she landed on her L knee.  She has 7/10 left knee pain, but states the bigger issue is BLE weakness since her knee "gave out" on her last week.  Prior to this she had been doing well.  She does have occasional left lower back pain for years but does not have any radiation of this pain down either leg.  She reports a small amount of numbness on left side which was diagnosed as neuropathy previously.  ED Course: CT head negative for stroke.  Imaging of L knee is negative.  Patient unsteady with gait.  On further questioning she admits that BLE claudication is improved since the angioplasty and stenting earlier this year but is still persistent and ongoing.  No urine or bowel incontinence or retention.   Assessment & Plan:   Principal Problem:   Bilateral leg weakness Active Problems:   PVD (peripheral vascular disease) (HCC)   Knee pain, right   BLE weakness  -Will obtain arterial US of BLE given the history of B PAD stenting earlier this year and suggestion of ongoing claudication despite this on history. - CT head negative for stroke despite 1 wk+ of symptoms - Symptoms have been slowly progressive per patient - Vascular surgery to set up follow up appointment outpatient - patient asking for help at home - await PT/OT recommendations   DVT prophylaxis: Lovenox Code Status: Full Family Communication: No family bedside Disposition Plan: Discharge tomorrow pending clearance by PT/OT   Consultants:   PT/ OT  Vascular Surgery  Procedures:   None  Antimicrobials:   None    Subjective: Patient seen  and evaluated.  She voices that her leg gave out about 18 days ago and that her weakness in her legs has been going on for about a week but voices that the left leg is weak from her knee if giving out.  Her right leg is weak secondary to her knee feeling like it's unstable.    Objective: Vitals:   09/02/16 0233 09/02/16 0504 09/02/16 0956 09/02/16 1447  BP: (!) 145/51 127/60 (!) 122/40 (!) 129/58  Pulse: 69 75  72  Resp: 20 20 16 18   Temp: 98.9 F (37.2 C) 98.7 F (37.1 C) 98.5 F (36.9 C) 98.7 F (37.1 C)  TempSrc: Oral Oral Oral Oral  SpO2: 99% 98% 94% 96%  Weight: 78.6 kg (173 lb 4.8 oz)     Height: 5\' 7"  (1.702 m)      No intake or output data in the 24 hours ending 09/02/16 1629 Filed Weights   09/01/16 1745 09/02/16 0233  Weight: 83.5 kg (184 lb) 78.6 kg (173 lb 4.8 oz)    Examination:  General exam: Appears calm and comfortable  Respiratory system: Clear to auscultation. Respiratory effort normal. Cardiovascular system: S1 & S2 heard, RRR. No JVD, murmurs, rubs, gallops or clicks. No pedal edema. Gastrointestinal system: Abdomen is nondistended, soft and nontender. No organomegaly or masses felt. Normal bowel sounds heard. Central nervous system: Alert and oriented. No focal neurological deficits. Extremities: Symmetric 5 x 5 power. No weakness appreciated.  Patient  has ambulated per patient and nursing staff Skin: No rashes, lesions or ulcers Psychiatry: Judgement and insight appear normal. Mood & affect appropriate.     Data Reviewed: I have personally reviewed following labs and imaging studies  CBC:  Recent Labs Lab 09/01/16 1743 09/01/16 1757  WBC 4.7  --   NEUTROABS 3.2  --   HGB 10.9* 11.6*  HCT 33.4* 34.0*  MCV 95.4  --   PLT 207  --    Basic Metabolic Panel:  Recent Labs Lab 09/01/16 1743 09/01/16 1757  NA 137 137  K 3.9 4.2  CL 103 107  CO2 21*  --   GLUCOSE 94 92  BUN 22* 24*  CREATININE 1.26* 1.30*  CALCIUM 9.2  --     GFR: Estimated Creatinine Clearance: 37.3 mL/min (by C-G formula based on SCr of 1.3 mg/dL (H)). Liver Function Tests:  Recent Labs Lab 09/01/16 1743  AST 18  ALT 10*  ALKPHOS 97  BILITOT 0.6  PROT 7.2  ALBUMIN 3.8   No results for input(s): LIPASE, AMYLASE in the last 168 hours. No results for input(s): AMMONIA in the last 168 hours. Coagulation Profile:  Recent Labs Lab 09/01/16 1743  INR 1.07   Cardiac Enzymes: No results for input(s): CKTOTAL, CKMB, CKMBINDEX, TROPONINI in the last 168 hours. BNP (last 3 results) No results for input(s): PROBNP in the last 8760 hours. HbA1C: No results for input(s): HGBA1C in the last 72 hours. CBG:  Recent Labs Lab 09/01/16 1755  GLUCAP 98   Lipid Profile: No results for input(s): CHOL, HDL, LDLCALC, TRIG, CHOLHDL, LDLDIRECT in the last 72 hours. Thyroid Function Tests: No results for input(s): TSH, T4TOTAL, FREET4, T3FREE, THYROIDAB in the last 72 hours. Anemia Panel: No results for input(s): VITAMINB12, FOLATE, FERRITIN, TIBC, IRON, RETICCTPCT in the last 72 hours. Sepsis Labs: No results for input(s): PROCALCITON, LATICACIDVEN in the last 168 hours.  No results found for this or any previous visit (from the past 240 hour(s)).       Radiology Studies: Ct Head Wo Contrast  Result Date: 09/01/2016 CLINICAL DATA:  Bilateral leg weakness and numbness history of fall EXAM: CT HEAD WITHOUT CONTRAST TECHNIQUE: Contiguous axial images were obtained from the base of the skull through the vertex without intravenous contrast. COMPARISON:  MRI 07/15/2015 FINDINGS: Brain: No acute territorial infarction or intracranial hemorrhage is visualized. Moderate severe periventricular and deep white matter hypodensities. Hypodensities within the bilateral thalamus and pons. Findings would be consistent with small vessel disease. No focal mass or midline shift. Mild atrophy. Stable ventricular size. Vascular: No hyperdense vessels.  Carotid  artery calcifications. Skull: Mastoid air cells are clear.  No skull fracture is seen. Sinuses/Orbits: Mucosal thickening in the paranasal sinuses. No acute orbital abnormality. Other: Prominent degenerative changes at C1-C2 articulation. IMPRESSION: 1. No CT evidence for acute intracranial abnormality. 2. Moderate to marked periventricular and deep white matter hypodensities consistent with small vessel disease. Electronically Signed   By: Jasmine PangKim  Fujinaga M.D.   On: 09/01/2016 18:51   Dg Knee Complete 4 Views Left  Result Date: 09/01/2016 CLINICAL DATA:  Acute onset of left knee pain and swelling after injury. Initial encounter. EXAM: LEFT KNEE - COMPLETE 4+ VIEW COMPARISON:  Left knee radiographs performed 08/13/2016 FINDINGS: There is no evidence of fracture or dislocation. Marginal osteophytes are noted arising at the medial lateral compartments, and at the tibial spine. Wall osteophytes are also seen. There is mild narrowing of the patellofemoral compartment. Underlying chondrocalcinosis and calcification  of the menisci are noted. A fabella is seen. Trace knee joint fluid remains within normal limits. A vascular stent is noted along the distal thigh. IMPRESSION: 1. No evidence of fracture or dislocation. 2. Mild tricompartmental osteoarthritis at the left knee. 3. Underlying chondrocalcinosis and meniscal calcification noted. Electronically Signed   By: Roanna RaiderJeffery  Chang M.D.   On: 09/01/2016 22:24        Scheduled Meds: . aspirin EC  81 mg Oral Daily  . atorvastatin  20 mg Oral q1800  . clopidogrel  75 mg Oral Daily  . enoxaparin (LOVENOX) injection  40 mg Subcutaneous Q24H  . escitalopram  20 mg Oral QHS  . ferrous sulfate  325 mg Oral QHS  . metoprolol tartrate  50 mg Oral QHS   And  . hydrochlorothiazide  25 mg Oral QHS  . loratadine  10 mg Oral Daily  . montelukast  10 mg Oral QHS   Continuous Infusions:   LOS: 0 days    Time spent: 25 minutes    Katrinka BlazingAlex U Kadolph, MD [facility] Pager [phone]  If 7PM-7AM, please contact night-coverage [email or web address] Password [email or web address] 09/02/2016, 4:29 PM

## 2016-09-02 NOTE — ED Notes (Signed)
Pt ao x 4 pt fell last week and is complaining of weakness. NIH negative, pt passed swallow screen and neurological checks are all WNL's. Pt uses walker to ambulate. 20G Iv in R forearm

## 2016-09-03 MED ORDER — ZOLPIDEM TARTRATE 5 MG PO TABS
5.0000 mg | ORAL_TABLET | Freq: Every evening | ORAL | Status: DC | PRN
  Administered 2016-09-03: 5 mg via ORAL
  Filled 2016-09-03: qty 1

## 2016-09-03 NOTE — Progress Notes (Signed)
Physical Therapy Treatment Patient Details Name: Nancy DollyDelores R Larsen MRN: [id number] DOB: 1934/05/27 Today's Date: 09/03/2016    History of Present Illness Patient is an 82 yo female admitted 09/01/16 with BLE weakness.  Patient had fall 1 week pta with pain in Lt knee.  Continues to have gait disturbance.   PMH:  PAD, angioplasty and stents BLE's, arthritis, asthma, CKD, depression, HTN.    PT Comments    Patient able to ambulate short distances with RW and min guard assist for safety.  Patient has 24/7 assist of family at d/c. Remains fall risk.  Recommend f/u HHPT for continued therapy - contacted CM.  Follow Up Recommendations  Home health PT;Supervision/Assistance - 24 hour     Equipment Recommendations  None recommended by PT    Recommendations for Other Services       Precautions / Restrictions Precautions Precautions: Fall Precaution Comments: Falls at home Restrictions Weight Bearing Restrictions: No    Mobility  Bed Mobility Overal bed mobility: Needs Assistance Bed Mobility: Supine to Sit     Supine to sit: Modified independent (Device/Increase time)     General bed mobility comments: Increased time and use of rail.  Transfers Overall transfer level: Needs assistance Equipment used: Rolling walker (2 wheeled) Transfers: Sit to/from Stand Sit to Stand: Min guard         General transfer comment: Patient used correct hand placement.  Min guard for safety only.  Ambulation/Gait Ambulation/Gait assistance: Min guard Ambulation Distance (Feet): 5 Feet Assistive device: Rolling walker (2 wheeled) Gait Pattern/deviations: Step-through pattern;Decreased step length - right;Decreased step length - left;Decreased stride length;Shuffle;Trunk flexed Gait velocity: decreased Gait velocity interpretation: Below normal speed for age/gender General Gait Details: Verbal cues to stand upright.  Patient ambulated in room to chair.  Patient declined to ambulate further.   Gait slow with flexed posture.  Proper use of RW noted.   Stairs            Wheelchair Mobility    Modified Rankin (Stroke Patients Only)       Balance           Standing balance support: Bilateral upper extremity supported Standing balance-Leahy Scale: Poor                      Cognition Arousal/Alertness: Awake/alert Behavior During Therapy: WFL for tasks assessed/performed;Flat affect Overall Cognitive Status: Within Functional Limits for tasks assessed                      Exercises      General Comments        Pertinent Vitals/Pain Pain Assessment: No/denies pain    Home Living                      Prior Function            PT Goals (current goals can now be found in the care plan section) Acute Rehab PT Goals Patient Stated Goal: To go home (now) Progress towards PT goals: Progressing toward goals    Frequency    Min 3X/week      PT Plan Current plan remains appropriate    Co-evaluation             End of Session Equipment Utilized During Treatment: Gait belt Activity Tolerance: Patient limited by fatigue Patient left: in chair;with call bell/phone within reach;with chair alarm set     Time: [id number] PT Time Calculation (  min) (ACUTE ONLY): 9 min  Charges:  $Gait Training: 8-22 mins                    G Codes:  Functional Assessment Tool Used: Clinical judgement Functional Limitation: Mobility: Walking and moving around Mobility: Walking and Moving Around Goal Status [phone](G8979): At least 1 percent but less than 20 percent impaired, limited or restricted Mobility: Walking and Moving Around Discharge Status [phone](G8980): At least 1 percent but less than 20 percent impaired, limited or restricted   Vena AustriaSusan H Jemuel Laursen 09/03/2016, 11:59 AM Durenda HurtSusan H. Renaldo Fiddleravis, PT, [facility] Acute Rehab Services Pager [phone]

## 2016-09-03 NOTE — Progress Notes (Signed)
Patient given discharge instructions.  All questions and concerns addressed. IV catheter removed without difficulty.   Patient left by wheelchair accompanied by staff with all belongings.

## 2016-09-03 NOTE — Discharge Instructions (Signed)
    Paresthesia Introduction Paresthesia is a burning or prickling feeling. This feeling can happen in any part of the body. It often happens in the hands, arms, legs, or feet. Usually, it is not painful. In most cases, the feeling goes away in a short time and is not a sign of a serious problem. Follow these instructions at home:  Avoid drinking alcohol.  Try massage or needle therapy (acupuncture) to help with your problems.  Keep all follow-up visits as told by your doctor. This is important. Contact a doctor if:  You keep on having episodes of paresthesia.  Your burning or prickling feeling gets worse when you walk.  You have pain or cramps.  You feel dizzy.  You have a rash. Get help right away if:  You feel weak.  You have trouble walking or moving.  You have problems speaking, understanding, or seeing.  You feel confused.  You cannot control when you pee (urinate) or poop (bowel movement).  You lose feeling (numbness) after an injury.  You pass out (faint). This information is not intended to replace advice given to you by your health care provider. Make sure you discuss any questions you have with your health care provider. Document Released: 08/31/2008 Document Revised: 02/24/2016 Document Reviewed: 09/14/2014  2017 Elsevier  

## 2016-09-03 NOTE — Care Management Note (Signed)
Case Management Note  Patient Details  Name: Nancy Larsen MRN: [id number] Date of Birth: 1934-07-04  Subjective/Objective:                  Bilateral weakness Action/Plan: Discharge planning Expected Discharge Date:  09/03/16               Expected Discharge Plan:  [place]  In-House Referral:     Discharge planning Services  CM Consult  Post Acute Care Choice:  Home Health Choice offered to:  Patient  DME Arranged:  3-N-1 DME Agency:  [facility] Arranged:  PT, OT [facility] Agency:  [place] [facility]  Status of Service:  Completed, signed off  If discussed at [place] of Stay Meetings, dates discussed:    Additional Comments: CM met with pt in room to offer choice of home health agency.  Pt chooses [facility] to render HHPT/OT.  Referral called to [facility] rep, Jermaine.  CM notified [place] DME rep, Reggie to please deliver the 3n1 to room prior to discharge.  NO other CM needs were communicated. Dellie Catholic, RN 09/03/2016, 12:37 PM

## 2016-09-03 NOTE — Evaluation (Signed)
Occupational Therapy Evaluation Patient Details Name: Nancy DollyDelores R Larsen MRN: [id number] DOB: 1934-05-12 Today's Date: 09/03/2016    History of Present Illness Patient is an 82 yo female admitted 09/01/16 with BLE weakness.  Patient had fall 1 week pta with pain in Lt knee.  Continues to have gait disturbance.   PMH:  PAD, angioplasty and stents BLE's, arthritis, asthma, CKD, depression, HTN.   Clinical Impression   Pt reports she was managing BADL independently, just receiving assist for tub transfer PTA. Currently pt overall min assist for ADL and functional mobility, supervision for seated UB ADL. Educated pt on home safety and fall prevention strategies. Pt planning to d/c home with 24/7 supervision from family. Recommending HHOT for follow up to maximize independence and safety with ADL and functional mobility upon return home. Pt would benefit from continued skilled OT to address established goals.    Follow Up Recommendations  Home health OT;Supervision/Assistance - 24 hour    Equipment Recommendations  Tub/shower bench    Recommendations for Other Services       Precautions / Restrictions Precautions Precautions: Fall Precaution Comments: Falls at home Restrictions Weight Bearing Restrictions: No      Mobility Bed Mobility Overal bed mobility: Needs Assistance Bed Mobility: Supine to Sit     Supine to sit: Modified independent (Device/Increase time)     General bed mobility comments: Pt OOB in chair upon arrival.  Transfers Overall transfer level: Needs assistance Equipment used: Rolling walker (2 wheeled) Transfers: Sit to/from Stand Sit to Stand: Min guard         General transfer comment: Min guard for safety. Good hand placement and technique. Pt mildly unsteady with initial sit to stand from chair; provided min assist during dynamic standing.    Balance Overall balance assessment: Needs assistance;History of Falls Sitting-balance support: Feet  supported;No upper extremity supported Sitting balance-Leahy Scale: Good     Standing balance support: Bilateral upper extremity supported Standing balance-Leahy Scale: Poor Standing balance comment: RW for support                            ADL Overall ADL's : Needs assistance/impaired Eating/Feeding: Set up;Sitting   Grooming: Set up;Supervision/safety;Sitting   Upper Body Bathing: Set up;Supervision/ safety;Sitting   Lower Body Bathing: Minimal assistance;Sit to/from stand Lower Body Bathing Details (indicate cue type and reason): Min assist for balance Upper Body Dressing : Set up;Supervision/safety;Sitting   Lower Body Dressing: Minimal assistance;Sit to/from stand Lower Body Dressing Details (indicate cue type and reason): Pt able to adjust socks sitting in chair Toilet Transfer: Minimal assistance;Ambulation;RW Toilet Transfer Details (indicate cue type and reason): Simulated by sit to stand from chair with functional mobility in room.         Functional mobility during ADLs: Minimal assistance;Rolling walker General ADL Comments: Educated pt on home safety and fall prevention strategies; pt reports she is very careful at home because she does not want to fall.     Vision Vision Assessment?: No apparent visual deficits   Perception     Praxis      Pertinent Vitals/Pain Pain Assessment: No/denies pain     Hand Dominance     Extremity/Trunk Assessment Upper Extremity Assessment Upper Extremity Assessment: Generalized weakness   Lower Extremity Assessment Lower Extremity Assessment: Defer to PT evaluation   Cervical / Trunk Assessment Cervical / Trunk Assessment: Kyphotic   Communication Communication Communication: No difficulties   Cognition Arousal/Alertness: Awake/alert Behavior  During Therapy: WFL for tasks assessed/performed;Flat affect Overall Cognitive Status: Within Functional Limits for tasks assessed                      General Comments       Exercises       Shoulder Instructions      Home Living Family/patient expects to be discharged to:: Private residence Living Arrangements: Other relatives (2 grandsons) Available Help at Discharge: Family;Available 24 hours/day Type of Home: House Home Access: Stairs to enter Entergy CorporationEntrance Stairs-Number of Steps: 3 Entrance Stairs-Rails: None Home Layout: One level     Bathroom Shower/Tub: Tub/shower unit Shower/tub characteristics: Curtain FirefighterBathroom Toilet: Handicapped height     Home Equipment: Environmental consultantWalker - 2 wheels;Shower seat;Cane - single point   Additional Comments: Uses desk chair with arms to roll around house.  Grandson rolls patient in this chair.      Prior Functioning/Environment Level of Independence: Needs assistance  Gait / Transfers Assistance Needed: Uses RW for gait.  Inside home, grandson pushes patient in a desk chair with arms.  Patient reports she also propels herself in this chair. ADL's / Homemaking Assistance Needed: Family assists with meal prep and housekeeping.  Patient reports she is still driving. Grandsons help pt into bathtub but she is independent with BADL.            OT Problem List: Decreased strength;Decreased activity tolerance;Impaired balance (sitting and/or standing);Decreased safety awareness;Decreased knowledge of use of DME or AE;Decreased knowledge of precautions   OT Treatment/Interventions: Self-care/ADL training;DME and/or AE instruction;Therapeutic exercise;Therapeutic activities;Patient/family education;Balance training    OT Goals(Current goals can be found in the care plan section) Acute Rehab OT Goals Patient Stated Goal: To go home (now) OT Goal Formulation: With patient Time For Goal Achievement: 09/17/16 Potential to Achieve Goals: Good ADL Goals Pt Will Perform Grooming: with supervision;standing Pt Will Perform Upper Body Bathing: sitting;with modified independence Pt Will Perform Lower Body  Bathing: with supervision;sit to/from stand Pt Will Transfer to Toilet: with supervision;ambulating;regular height toilet Pt Will Perform Toileting - Clothing Manipulation and hygiene: with supervision;sit to/from stand Pt Will Perform Tub/Shower Transfer: with min assist;Tub transfer;ambulating;shower seat;rolling walker  OT Frequency: Min 2X/week   Barriers to D/C:            Co-evaluation              End of Session Equipment Utilized During Treatment: Rolling walker  Activity Tolerance: Patient tolerated treatment well Patient left: in chair;with call bell/phone within reach   Time: 1210-1225 OT Time Calculation (min): 15 min Charges:  OT General Charges $OT Visit: 1 Procedure OT Evaluation $OT Eval Moderate Complexity: 1 Procedure G-Codes: OT G-codes **NOT FOR INPATIENT CLASS** Functional Assessment Tool Used: Clinical judgement Functional Limitation: Self care Self Care Current Status (R6045(G8987): At least 1 percent but less than 20 percent impaired, limited or restricted Self Care Goal Status (W0981(G8988): At least 1 percent but less than 20 percent impaired, limited or restricted   Gaye AlkenBailey A Nicoletta Hush M.S., OTR/L Pager: [phone]  09/03/2016, 1:20 PM

## 2016-09-04 ENCOUNTER — Other Ambulatory Visit: Payer: Self-pay

## 2016-09-04 DIAGNOSIS — I739 Peripheral vascular disease, unspecified: Secondary | ICD-10-CM

## 2016-09-04 DIAGNOSIS — R29898 Other symptoms and signs involving the musculoskeletal system: Secondary | ICD-10-CM

## 2016-09-04 DIAGNOSIS — Z959 Presence of cardiac and vascular implant and graft, unspecified: Secondary | ICD-10-CM

## 2016-09-06 ENCOUNTER — Telehealth: Payer: Self-pay | Admitting: Vascular Surgery

## 2016-09-06 NOTE — Telephone Encounter (Signed)
[facility] Sched MD 10/27/16 at 10:45. Pt's ph# has no vm, mailed appt letter through regular mail.

## 2016-09-06 NOTE — Telephone Encounter (Signed)
-----   Message from Phillips Odorarol S Pullins, RN sent at 09/04/2016 12:43 PM EST ----- Regarding: needs 4-6 week f/u with CTA abd/pelvis with bilat runoff and OV with Dr. Randie Heinzain   ----- Message ----- From: Maeola HarmanBrandon Christopher Cain, MD Sent: 09/02/2016   5:10 PM To: [address] Charge Pool  Paulla DollyDelores R Cecchi [id number] [birth year]  Inpatient level 3 consult Dx: pad   Needs f/u in 4-6 weeks with CT angio abd/pelvis and ble runoff

## 2016-09-08 ENCOUNTER — Ambulatory Visit (INDEPENDENT_AMBULATORY_CARE_PROVIDER_SITE_OTHER): Payer: Medicare HMO | Admitting: Vascular Surgery

## 2016-09-13 ENCOUNTER — Telehealth (INDEPENDENT_AMBULATORY_CARE_PROVIDER_SITE_OTHER): Payer: Self-pay | Admitting: Vascular Surgery

## 2016-09-13 NOTE — Telephone Encounter (Signed)
Pt stated her legs are numb and weak and can't hold her up. Pt need advise. She stated she spent 3 days at the hospital last. Phone #[phone]

## 2016-09-19 ENCOUNTER — Ambulatory Visit (INDEPENDENT_AMBULATORY_CARE_PROVIDER_SITE_OTHER): Payer: Medicare HMO | Admitting: Vascular Surgery

## 2016-09-19 ENCOUNTER — Encounter (INDEPENDENT_AMBULATORY_CARE_PROVIDER_SITE_OTHER): Payer: Self-pay | Admitting: Vascular Surgery

## 2016-09-19 VITALS — BP 115/62 | HR 76 | Resp 16 | Ht 70.0 in | Wt 180.0 lb

## 2016-09-19 DIAGNOSIS — I70219 Atherosclerosis of native arteries of extremities with intermittent claudication, unspecified extremity: Secondary | ICD-10-CM | POA: Insufficient documentation

## 2016-09-19 DIAGNOSIS — E785 Hyperlipidemia, unspecified: Secondary | ICD-10-CM | POA: Insufficient documentation

## 2016-09-19 DIAGNOSIS — I70213 Atherosclerosis of native arteries of extremities with intermittent claudication, bilateral legs: Secondary | ICD-10-CM | POA: Diagnosis not present

## 2016-09-19 DIAGNOSIS — R29898 Other symptoms and signs involving the musculoskeletal system: Secondary | ICD-10-CM | POA: Diagnosis not present

## 2016-09-19 NOTE — Assessment & Plan Note (Signed)
Although this could clearly be from neurogenic reasons, given her extensive history of peripheral vascular disease this needs to be evaluated.

## 2016-09-19 NOTE — Assessment & Plan Note (Signed)
The patient describes lower extremity symptoms which are worrisome for progression of her atherosclerotic disease. She has previously undergone both lower extremity interventions in the past and was doing reasonably well earlier this year. At this point, she is debilitated with minimal activity and is having some pain at rest. This is worrisome for progression of her disease and occlusion of her previous interventions. Her legs were also cooler to the touch. We discussed that issue such as neurogenic problems could also be causing similar symptoms, but we need to assess her arterial circuit in the near future at her convenience. I will see her back following the study to discuss the results and determine further treatment options.

## 2016-09-19 NOTE — Assessment & Plan Note (Signed)
lipid control important in reducing the progression of atherosclerotic disease. Continue statin therapy  

## 2016-09-19 NOTE — Progress Notes (Signed)
MRN : [id number]  Nancy Larsen is a 82 y.o. (1933/11/20) female who presents with chief complaint of  Chief Complaint  Patient presents with  . Re-evaluation    Legs numb   .  History of Present Illness: Patient returns today in follow up of Lower extremity pain and weakness. She reports this has progressed over the past several weeks. She had both lower extremities treated for peripheral arterial disease with revascularization performed about 2 years ago. She originally did pretty well with this. Her legs are now cool to the touch and she says they give out on her with any activity. She reports no ulceration or infection. There is no clear antecedent event or causative factor that started the symptoms. Both lower extremities are affected.  Current Outpatient Prescriptions  Medication Sig Dispense Refill  . albuterol (VENTOLIN HFA) 108 (90 Base) MCG/ACT inhaler Inhale 2 puffs into the lungs every 6 (six) hours as needed for wheezing or shortness of breath. FOR SEASONAL ALLERGIES    . aspirin EC 81 MG tablet Take 1 tablet (81 mg total) by mouth daily. 150 tablet 2  . atorvastatin (LIPITOR) 20 MG tablet Take 1 tablet (20 mg total) by mouth daily. 30 tablet 5  . clopidogrel (PLAVIX) 75 MG tablet Take 1 tablet (75 mg total) by mouth daily. 30 tablet 11  . escitalopram (LEXAPRO) 20 MG tablet Take 20 mg by mouth at bedtime.    . ferrous sulfate 325 (65 FE) MG tablet Take 325 mg by mouth at bedtime.     . fexofenadine (ALLEGRA) 180 MG tablet Take 180 mg by mouth daily.    Marland Kitchen gabapentin (NEURONTIN) 100 MG capsule   4  . HYDROcodone-acetaminophen (NORCO/VICODIN) 5-325 MG tablet     . meclizine (ANTIVERT) 25 MG tablet Take 25 mg by mouth every 8 (eight) hours as needed for dizziness.     . metoprolol-hydrochlorothiazide (LOPRESSOR HCT) 50-25 MG tablet Take 1 tablet by mouth at bedtime.    . montelukast (SINGULAIR) 10 MG tablet Take 10 mg by mouth at bedtime.   3  . Propylene Glycol (SYSTANE  BALANCE) 0.6 % SOLN Place 1-2 drops into both eyes 3 (three) times daily as needed (for itchiness or irritation).    . Vitamin D, Ergocalciferol, (DRISDOL) 50000 UNITS CAPS capsule Take 50,000 Units by mouth every 30 (thirty) days. Reported on 01/10/2016  0   No current facility-administered medications for this visit.     Past Medical History:  Diagnosis Date  . Arthritis   . Asthma   . Chronic kidney disease   . Depression   . Hypertension     Past Surgical History:  Procedure Laterality Date  . ABDOMINAL HYSTERECTOMY    . PERIPHERAL VASCULAR CATHETERIZATION Left 01/10/2016   Procedure: Lower Extremity Angiography;  Surgeon: Algernon Huxley, MD;  Location: [facility];  Service: Cardiovascular;  Laterality: Left;  . PERIPHERAL VASCULAR CATHETERIZATION  01/10/2016   Procedure: Lower Extremity Intervention;  Surgeon: Algernon Huxley, MD;  Location: [facility];  Service: Cardiovascular;;  . PERIPHERAL VASCULAR CATHETERIZATION Right 01/17/2016   Procedure: Lower Extremity Angiography;  Surgeon: Algernon Huxley, MD;  Location: [facility];  Service: Cardiovascular;  Laterality: Right;  . PERIPHERAL VASCULAR CATHETERIZATION  01/17/2016   Procedure: Lower Extremity Intervention;  Surgeon: Algernon Huxley, MD;  Location: [facility];  Service: Cardiovascular;;  . THYROID SURGERY      Social History Social History  Substance Use Topics  . Smoking status: Current Every Day Smoker    Packs/day: 0.50  . Smokeless tobacco: Never Used  . Alcohol use No    Family History Family History  Problem Relation Age of Onset  . CAD Mother   . Colon cancer Mother   . Heart disease Father     Allergies  Allergen Reactions  . Codeine Other (See Comments)    Dysuria  . Symbicort [Budesonide-Formoterol Fumarate] Other (See Comments)    Made patient jittery     REVIEW OF SYSTEMS (Negative unless checked)  Constitutional: []Weight loss  []Fever  []Chills Cardiac: []Chest  pain   []Chest pressure   []Palpitations   []Shortness of breath when laying flat   []Shortness of breath at rest   []Shortness of breath with exertion. Vascular:  [x]Pain in legs with walking   [x]Pain in legs at rest   []Pain in legs when laying flat   [x]Claudication   []Pain in feet when walking  []Pain in feet at rest  []Pain in feet when laying flat   []History of DVT   []Phlebitis   []Swelling in legs   []Varicose veins   []Non-healing ulcers Pulmonary:   []Uses home oxygen   []Productive cough   []Hemoptysis   []Wheeze  []COPD   []Asthma Neurologic:  []Dizziness  []Blackouts   []Seizures   []History of stroke   []History of TIA  []Aphasia   []Temporary blindness   []Dysphagia   []Weakness or numbness in arms   [x]Weakness or numbness in legs Musculoskeletal:  []Arthritis   []Joint swelling   []Joint pain   []Low back pain Hematologic:  []Easy bruising  []Easy bleeding   []Hypercoagulable state   []Anemic   Gastrointestinal:  []Blood in stool   []Vomiting blood  []Gastroesophageal reflux/heartburn   []Abdominal pain Genitourinary:  []Chronic kidney disease   []Difficult urination  []Frequent urination  []Burning with urination   []Hematuria Skin:  []Rashes   []Ulcers   []Wounds Psychological:  []History of anxiety   [] History of major depression.  Physical Examination  BP 115/62 (BP Location: Right Arm)   Pulse 76   Resp 16   Ht 5' 10" (1.778 m)   Wt 180 lb (81.6 kg)   BMI 25.83 kg/m  Gen:  WD/WN, NAD Head: [place]/AT, No temporalis wasting. Ear/Nose/Throat: Hearing grossly intact, nares w/o erythema or drainage, trachea midline Eyes: Conjunctiva clear. Sclera non-icteric Neck: Supple.  No JVD.  Pulmonary:  Good air movement, no use of accessory muscles.  Cardiac: RRR, normal S1, S2 Vascular:  Vessel Right Left  Radial Palpable Palpable  Ulnar Palpable Palpable  Brachial Palpable Palpable  Carotid Palpable, without bruit Palpable, without bruit  Aorta Not palpable N/A  Femoral  Palpable Palpable  Popliteal Not Palpable Not Palpable  PT 1+ Palpable Trace Palpable  DP Not Palpable 1+ Palpable   Gastrointestinal: soft, non-tender/non-distended. No guarding/reflex.  Musculoskeletal: M/S 5/5 throughout.  No deformity or atrophy. Both LE are cool to the touch from the calf down. Using a wheelchair today Neurologic: Sensation grossly intact in extremities.  Symmetrical.  Speech is fluent.  Psychiatric: Judgment intact, Mood & affect appropriate for pt's clinical situation. Dermatologic: No rashes or ulcers noted.  No cellulitis or open wounds. Lymph : No Cervical, Axillary, or Inguinal lymphadenopathy.      Labs Recent Results (from the past 2160 hour(s))  Protime-INR     Status: None   Collection Time: 09/01/16  5:43 PM  Result  Value Ref Range   Prothrombin Time 14.0 11.4 - 15.2 seconds   INR 1.07   APTT     Status: None   Collection Time: 09/01/16  5:43 PM  Result Value Ref Range   aPTT 31 24 - 36 seconds  CBC     Status: Abnormal   Collection Time: 09/01/16  5:43 PM  Result Value Ref Range   WBC 4.7 4.0 - 10.5 K/uL   RBC 3.50 (L) 3.87 - 5.11 MIL/uL   Hemoglobin 10.9 (L) 12.0 - 15.0 g/dL   HCT 33.4 (L) 36.0 - 46.0 %   MCV 95.4 78.0 - 100.0 fL   MCH 31.1 26.0 - 34.0 pg   MCHC 32.6 30.0 - 36.0 g/dL   RDW 13.1 11.5 - 15.5 %   Platelets 207 150 - 400 K/uL  Differential     Status: None   Collection Time: 09/01/16  5:43 PM  Result Value Ref Range   Neutrophils Relative % 68 %   Neutro Abs 3.2 1.7 - 7.7 K/uL   Lymphocytes Relative 22 %   Lymphs Abs 1.0 0.7 - 4.0 K/uL   Monocytes Relative 7 %   Monocytes Absolute 0.3 0.1 - 1.0 K/uL   Eosinophils Relative 3 %   Eosinophils Absolute 0.1 0.0 - 0.7 K/uL   Basophils Relative 0 %   Basophils Absolute 0.0 0.0 - 0.1 K/uL  Comprehensive metabolic panel     Status: Abnormal   Collection Time: 09/01/16  5:43 PM  Result Value Ref Range   Sodium 137 135 - 145 mmol/L   Potassium 3.9 3.5 - 5.1 mmol/L    Chloride 103 101 - 111 mmol/L   CO2 21 (L) 22 - 32 mmol/L   Glucose, Bld 94 65 - 99 mg/dL   BUN 22 (H) 6 - 20 mg/dL   Creatinine, Ser 1.26 (H) 0.44 - 1.00 mg/dL   Calcium 9.2 8.9 - 10.3 mg/dL   Total Protein 7.2 6.5 - 8.1 g/dL   Albumin 3.8 3.5 - 5.0 g/dL   AST 18 15 - 41 U/L   ALT 10 (L) 14 - 54 U/L   Alkaline Phosphatase 97 38 - 126 U/L   Total Bilirubin 0.6 0.3 - 1.2 mg/dL   GFR calc non Af Amer 39 (L) >60 mL/min   GFR calc Af Amer 45 (L) >60 mL/min    Comment: (NOTE) The eGFR has been calculated using the CKD EPI equation. This calculation has not been validated in all clinical situations. eGFR's persistently <60 mL/min signify possible Chronic Kidney Disease.    Anion gap 13 5 - 15  I-stat troponin, ED     Status: None   Collection Time: 09/01/16  5:54 PM  Result Value Ref Range   Troponin i, poc 0.00 0.00 - 0.08 ng/mL   Comment 3            Comment: Due to the release kinetics of cTnI, a negative result within the first hours of the onset of symptoms does not rule out myocardial infarction with certainty. If myocardial infarction is still suspected, repeat the test at appropriate intervals.   CBG monitoring, ED     Status: None   Collection Time: 09/01/16  5:55 PM  Result Value Ref Range   Glucose-Capillary 98 65 - 99 mg/dL  I-Stat Chem 8, ED     Status: Abnormal   Collection Time: 09/01/16  5:57 PM  Result Value Ref Range   Sodium 137 135 - 145 mmol/L  Potassium 4.2 3.5 - 5.1 mmol/L   Chloride 107 101 - 111 mmol/L   BUN 24 (H) 6 - 20 mg/dL   Creatinine, Ser 1.30 (H) 0.44 - 1.00 mg/dL   Glucose, Bld 92 65 - 99 mg/dL   Calcium, Ion 1.00 (L) 1.15 - 1.40 mmol/L   TCO2 21 0 - 100 mmol/L   Hemoglobin 11.6 (L) 12.0 - 15.0 g/dL   HCT 34.0 (L) 36.0 - 46.0 %  VAS [place] LOWER EXTREMITY ARTERIAL DUPLEX     Status: None   Collection Time: 09/02/16 12:42 PM  Result Value Ref Range   Left super femoral prox sys PSV 473 cm/s   Left super femoral mid sys PSV -24 cm/s   Left  super femoral dist sys PSV -28 cm/s   Left popliteal prox sys PSV 79 cm/s   Left popliteal dist sys PSV 70 cm/s   Left ant tibial sys PSV 63 cm/s   Right super femoral prox sys PSV 155 cm/s   Right super femoral mid sys PSV -64 cm/s   Right super femoral dist sys PSV -72 cm/s   Right popliteal prox sys PSV 70 cm/s   Right popliteal dist sys PSV 84 cm/s   Right post tibial sys PSV 88 cm/s   Left ant tibial mid sys 30 cm/s   Left ant tibial distal sys 28 cm/s   left post tibial dist sys 6 cm/s   left post tibial mid sys 34 cm/s   LEFT PERO DIST SYS 31.00 cm/s   LEFT PERO MID SYS 36 cm/s   RIGHT ANT MID TIBIAL SYS PSV -18 cm/s   RIGHT ANT DIST TIBAL SYS PSV -54 cm/s   RIGHT POST TIB DIST SYS 54 cm/s   RIGHT POST TIB MID SYS 77 cm/s   RIGHT PERO MID SYS 61 cm/s   LEFT SFA PROX DYS VEL 162 cm/s    Radiology Ct Head Wo Contrast  Result Date: 09/01/2016 CLINICAL DATA:  Bilateral leg weakness and numbness history of fall EXAM: CT HEAD WITHOUT CONTRAST TECHNIQUE: Contiguous axial images were obtained from the base of the skull through the vertex without intravenous contrast. COMPARISON:  MRI 07/15/2015 FINDINGS: Brain: No acute territorial infarction or intracranial hemorrhage is visualized. Moderate severe periventricular and deep white matter hypodensities. Hypodensities within the bilateral thalamus and pons. Findings would be consistent with small vessel disease. No focal mass or midline shift. Mild atrophy. Stable ventricular size. Vascular: No hyperdense vessels.  Carotid artery calcifications. Skull: Mastoid air cells are clear.  No skull fracture is seen. Sinuses/Orbits: Mucosal thickening in the paranasal sinuses. No acute orbital abnormality. Other: Prominent degenerative changes at C1-C2 articulation. IMPRESSION: 1. No CT evidence for acute intracranial abnormality. 2. Moderate to marked periventricular and deep white matter hypodensities consistent with small vessel disease.  Electronically Signed   By: Donavan Foil M.D.   On: 09/01/2016 18:51   Dg Knee Complete 4 Views Left  Result Date: 09/01/2016 CLINICAL DATA:  Acute onset of left knee pain and swelling after injury. Initial encounter. EXAM: LEFT KNEE - COMPLETE 4+ VIEW COMPARISON:  Left knee radiographs performed 08/13/2016 FINDINGS: There is no evidence of fracture or dislocation. Marginal osteophytes are noted arising at the medial lateral compartments, and at the tibial spine. Wall osteophytes are also seen. There is mild narrowing of the patellofemoral compartment. Underlying chondrocalcinosis and calcification of the menisci are noted. A fabella is seen. Trace knee joint fluid remains within normal limits. A vascular stent is  noted along the distal thigh. IMPRESSION: 1. No evidence of fracture or dislocation. 2. Mild tricompartmental osteoarthritis at the left knee. 3. Underlying chondrocalcinosis and meniscal calcification noted. Electronically Signed   By: Garald Balding M.D.   On: 09/01/2016 22:24      Assessment/Plan  Bilateral leg weakness Although this could clearly be from neurogenic reasons, given her extensive history of peripheral vascular disease this needs to be evaluated.  Hyperlipidemia lipid control important in reducing the progression of atherosclerotic disease. Continue statin therapy   Atherosclerosis of native arteries of extremity with intermittent claudication (HCC) The patient describes lower extremity symptoms which are worrisome for progression of her atherosclerotic disease. She has previously undergone both lower extremity interventions in the past and was doing reasonably well earlier this year. At this point, she is debilitated with minimal activity and is having some pain at rest. This is worrisome for progression of her disease and occlusion of her previous interventions. Her legs were also cooler to the touch. We discussed that issue such as neurogenic problems could also be  causing similar symptoms, but we need to assess her arterial circuit in the near future at her convenience. I will see her back following the study to discuss the results and determine further treatment options.    Leotis Pain, MD  09/19/2016 12:33 PM    This note was created with Dragon medical transcription system.  Any errors from dictation are purely unintentional

## 2016-09-26 ENCOUNTER — Telehealth (INDEPENDENT_AMBULATORY_CARE_PROVIDER_SITE_OTHER): Payer: Self-pay | Admitting: Vascular Surgery

## 2016-09-26 NOTE — Telephone Encounter (Signed)
I Called to move patient Nancy Larsen up from 1/23. No answer/no Voicemail. Phone rung till it went busy.

## 2016-09-28 ENCOUNTER — Ambulatory Visit (INDEPENDENT_AMBULATORY_CARE_PROVIDER_SITE_OTHER): Payer: Medicare HMO

## 2016-09-28 ENCOUNTER — Other Ambulatory Visit (INDEPENDENT_AMBULATORY_CARE_PROVIDER_SITE_OTHER): Payer: Self-pay | Admitting: Vascular Surgery

## 2016-09-28 DIAGNOSIS — I70213 Atherosclerosis of native arteries of extremities with intermittent claudication, bilateral legs: Secondary | ICD-10-CM

## 2016-09-29 ENCOUNTER — Ambulatory Visit (INDEPENDENT_AMBULATORY_CARE_PROVIDER_SITE_OTHER): Payer: Medicare HMO | Admitting: Vascular Surgery

## 2016-09-29 ENCOUNTER — Encounter (INDEPENDENT_AMBULATORY_CARE_PROVIDER_SITE_OTHER): Payer: Self-pay | Admitting: Vascular Surgery

## 2016-09-29 VITALS — BP 148/75 | HR 72 | Resp 16 | Ht 66.0 in | Wt 180.0 lb

## 2016-09-29 DIAGNOSIS — E785 Hyperlipidemia, unspecified: Secondary | ICD-10-CM

## 2016-09-29 DIAGNOSIS — I70213 Atherosclerosis of native arteries of extremities with intermittent claudication, bilateral legs: Secondary | ICD-10-CM | POA: Diagnosis not present

## 2016-09-29 NOTE — Assessment & Plan Note (Signed)
The patient's noninvasive studies today demonstrate occlusion of her left SFA stent as well as what appears to be at least moderate if not high-grade stenosis in her right popliteal artery at the bottom edge or just below her previously placed SFA/popliteal stent. It would appear that this is the cause of her recurrent claudication symptoms. With her symptoms being near rest pain at this point, I think we need to proceed with intervention sooner rather than later. She will continue her aspirin, Plavix, and Lipitor. Risks and benefits of angiography with possible revascularization were discussed and the patient is agreeable to proceed.

## 2016-09-29 NOTE — Assessment & Plan Note (Signed)
lipid control important in reducing the progression of atherosclerotic disease. Continue statin therapy  

## 2016-09-29 NOTE — Progress Notes (Signed)
MRN : [id number]  Nancy Larsen is a 82 y.o. ([birth year]) female who presents with chief complaint of  Chief Complaint  Patient presents with  . Re-evaluation    Stent is occluded  .  History of Present Illness: Patient returns today in follow up of Her worsening leg pain. She has similar symptoms to what she described less than 2 weeks ago at her initial visit. For further evaluation of her perfusion, duplex was done today.  The patient's noninvasive studies today demonstrate occlusion of her left SFA stent as well as what appears to be at least moderate if not high-grade stenosis in her right popliteal artery at the bottom edge or just below her previously placed SFA/popliteal stent.    Current Outpatient Prescriptions  Medication Sig Dispense Refill  . albuterol (VENTOLIN HFA) 108 (90 Base) MCG/ACT inhaler Inhale 2 puffs into the lungs every 6 (six) hours as needed for wheezing or shortness of breath. FOR SEASONAL ALLERGIES    . aspirin EC 81 MG tablet Take 1 tablet (81 mg total) by mouth daily. 150 tablet 2  . atorvastatin (LIPITOR) 20 MG tablet Take 1 tablet (20 mg total) by mouth daily. 30 tablet 5  . clopidogrel (PLAVIX) 75 MG tablet Take 1 tablet (75 mg total) by mouth daily. 30 tablet 11  . escitalopram (LEXAPRO) 20 MG tablet Take 20 mg by mouth at bedtime.    . ferrous sulfate 325 (65 FE) MG tablet Take 325 mg by mouth at bedtime.     . fexofenadine (ALLEGRA) 180 MG tablet Take 180 mg by mouth daily.    Marland Kitchen gabapentin (NEURONTIN) 100 MG capsule   4  . HYDROcodone-acetaminophen (NORCO/VICODIN) 5-325 MG tablet     . meclizine (ANTIVERT) 25 MG tablet Take 25 mg by mouth every 8 (eight) hours as needed for dizziness.     . metoprolol-hydrochlorothiazide (LOPRESSOR HCT) 50-25 MG tablet Take 1 tablet by mouth at bedtime.    . montelukast (SINGULAIR) 10 MG tablet Take 10 mg by mouth at bedtime.   3  . Propylene Glycol (SYSTANE BALANCE) 0.6 % SOLN Place 1-2 drops  into both eyes 3 (three) times daily as needed (for itchiness or irritation).    . Vitamin D, Ergocalciferol, (DRISDOL) 50000 UNITS CAPS capsule Take 50,000 Units by mouth every 30 (thirty) days. Reported on 01/10/2016  0   No current facility-administered medications for this visit.         Past Medical History:  Diagnosis Date  . Arthritis   . Asthma   . Chronic kidney disease   . Depression   . Hypertension          Past Surgical History:  Procedure Laterality Date  . ABDOMINAL HYSTERECTOMY    . PERIPHERAL VASCULAR CATHETERIZATION Left 01/10/2016   Procedure: Lower Extremity Angiography;  Surgeon: Algernon Huxley, MD;  Location: [facility];  Service: Cardiovascular;  Laterality: Left;  . PERIPHERAL VASCULAR CATHETERIZATION  01/10/2016   Procedure: Lower Extremity Intervention;  Surgeon: Algernon Huxley, MD;  Location: [facility];  Service: Cardiovascular;;  . PERIPHERAL VASCULAR CATHETERIZATION Right 01/17/2016   Procedure: Lower Extremity Angiography;  Surgeon: Algernon Huxley, MD;  Location: [facility];  Service: Cardiovascular;  Laterality: Right;  . PERIPHERAL VASCULAR CATHETERIZATION  01/17/2016   Procedure: Lower Extremity Intervention;  Surgeon: Algernon Huxley, MD;  Location: [facility];  Service: Cardiovascular;;  . THYROID SURGERY      Social  History      Social History  Substance Use Topics  . Smoking status: Current Every Day Smoker    Packs/day: 0.50  . Smokeless tobacco: Never Used  . Alcohol use No    Family History      Family History  Problem Relation Age of Onset  . CAD Mother   . Colon cancer Mother   . Heart disease Father          Allergies  Allergen Reactions  . Codeine Other (See Comments)    Dysuria  . Symbicort [Budesonide-Formoterol Fumarate] Other (See Comments)    Made patient jittery     REVIEW OF SYSTEMS (Negative unless checked)  Constitutional: _0 Weight loss   _1 Fever  _2 Chills Cardiac: _3 Chest pain   _4 Chest pressure   _5 Palpitations   _6 Shortness of breath when laying flat   _7 Shortness of breath at rest   _8 Shortness of breath with exertion. Vascular:  _9 Pain in legs with walking   _10 Pain in legs at rest   _11 Pain in legs when laying flat   _12 Claudication   _13 Pain in feet when walking  _14 Pain in feet at rest  _15 Pain in feet when laying flat   _16 History of DVT   _17 Phlebitis   _18 Swelling in legs   _19 Varicose veins   _20 Non-healing ulcers Pulmonary:   _21 Uses home oxygen   _22 Productive cough   _23 Hemoptysis   _24 Wheeze  _25 COPD   _26 Asthma Neurologic:  _27 Dizziness  _28 Blackouts   _29 Seizures   _30 History of stroke   _31 History of TIA  _32 Aphasia   _33 Temporary blindness   _34 Dysphagia   _35 Weakness or numbness in arms   _36 Weakness or numbness in legs Musculoskeletal:  _37 Arthritis   _38 Joint swelling   _39 Joint pain   _40 Low back pain Hematologic:  _41 Easy bruising  _42 Easy bleeding   _43 Hypercoagulable state   _44 Anemic   Gastrointestinal:  _45 Blood in stool   _46 Vomiting blood  _47 Gastroesophageal reflux/heartburn   _48 Abdominal pain Genitourinary:  _49 Chronic kidney disease   _50 Difficult urination  _51 Frequent urination  _52 Burning with urination   _53 Hematuria Skin:  _54 Rashes   _55 Ulcers   _56 Wounds Psychological:  _57 History of anxiety   _58  History of major depression.  Physical Examination  BP 115/62 (BP Location: Right Arm)   Pulse 76   Resp 16   Ht _59  (1.778 m)   Wt 180 lb (81.6 kg)   BMI 25.83 kg/m  Gen:  WD/WN, NAD Head: [place]/AT, No temporalis wasting. Ear/Nose/Throat: Hearing grossly intact, nares w/o erythema or drainage, trachea midline Eyes: Conjunctiva clear. Sclera non-icteric Neck: Supple.  No JVD.  Pulmonary:  Good air movement, no use of accessory muscles.  Cardiac: RRR, normal S1, S2 Vascular:  Vessel Right Left  Radial Palpable Palpable  Ulnar Palpable Palpable  Brachial Palpable Palpable  Carotid Palpable, without bruit Palpable, without bruit   Aorta Not palpable N/A  Femoral Palpable Palpable  Popliteal Not Palpable Not Palpable  PT 1+ Palpable Trace Palpable  DP Not Palpable 1+ Palpable   Gastrointestinal: soft, non-tender/non-distended. No guarding/reflex.  Musculoskeletal: M/S 5/5 throughout.  No deformity or atrophy. Both LE are cool to the touch from the calf down. Using a wheelchair today Neurologic: Sensation grossly intact in extremities.  Symmetrical.  Speech is fluent.  Psychiatric: Judgment intact, Mood & affect appropriate for pt's clinical situation. Dermatologic: No rashes or ulcers noted.  No cellulitis or open wounds. Lymph : No Cervical, Axillary, or Inguinal lymphadenopathy.      Labs Recent Results (from the past 2160  hour(s))  Protime-INR     Status: None   Collection Time: 09/01/16  5:43 PM  Result Value Ref Range   Prothrombin Time 14.0 11.4 - 15.2 seconds   INR 1.07   APTT     Status: None   Collection Time: 09/01/16  5:43 PM  Result Value Ref Range   aPTT 31 24 - 36 seconds  CBC     Status: Abnormal   Collection Time: 09/01/16  5:43 PM  Result Value Ref Range   WBC 4.7 4.0 - 10.5 K/uL   RBC 3.50 (L) 3.87 - 5.11 MIL/uL   Hemoglobin 10.9 (L) 12.0 - 15.0 g/dL   HCT 33.4 (L) 36.0 - 46.0 %   MCV 95.4 78.0 - 100.0 fL   MCH 31.1 26.0 - 34.0 pg   MCHC 32.6 30.0 - 36.0 g/dL   RDW 13.1 11.5 - 15.5 %   Platelets 207 150 - 400 K/uL  Differential     Status: None   Collection Time: 09/01/16  5:43 PM  Result Value Ref Range   Neutrophils Relative % 68 %   Neutro Abs 3.2 1.7 - 7.7 K/uL   Lymphocytes Relative 22 %   Lymphs Abs 1.0 0.7 - 4.0 K/uL   Monocytes Relative 7 %   Monocytes Absolute 0.3 0.1 - 1.0 K/uL   Eosinophils Relative 3 %   Eosinophils Absolute 0.1 0.0 - 0.7 K/uL   Basophils Relative 0 %   Basophils Absolute 0.0 0.0 - 0.1 K/uL  Comprehensive metabolic panel     Status: Abnormal   Collection Time: 09/01/16  5:43 PM  Result Value Ref Range   Sodium 137 135 - 145 mmol/L    Potassium 3.9 3.5 - 5.1 mmol/L   Chloride 103 101 - 111 mmol/L   CO2 21 (L) 22 - 32 mmol/L   Glucose, Bld 94 65 - 99 mg/dL   BUN 22 (H) 6 - 20 mg/dL   Creatinine, Ser 1.26 (H) 0.44 - 1.00 mg/dL   Calcium 9.2 8.9 - 10.3 mg/dL   Total Protein 7.2 6.5 - 8.1 g/dL   Albumin 3.8 3.5 - 5.0 g/dL   AST 18 15 - 41 U/L   ALT 10 (L) 14 - 54 U/L   Alkaline Phosphatase 97 38 - 126 U/L   Total Bilirubin 0.6 0.3 - 1.2 mg/dL   GFR calc non Af Amer 39 (L) >60 mL/min   GFR calc Af Amer 45 (L) >60 mL/min    Comment: (NOTE) The eGFR has been calculated using the CKD EPI equation. This calculation has not been validated in all clinical situations. eGFR's persistently <60 mL/min signify possible Chronic Kidney Disease.    Anion gap 13 5 - 15  I-stat troponin, ED     Status: None   Collection Time: 09/01/16  5:54 PM  Result Value Ref Range   Troponin i, poc 0.00 0.00 - 0.08 ng/mL   Comment 3            Comment: Due to the release kinetics of cTnI, a negative result within the first hours of the onset of symptoms does not rule out myocardial infarction with certainty. If myocardial infarction is still suspected, repeat the test at appropriate intervals.   CBG monitoring, ED     Status: None   Collection Time: 09/01/16  5:55 PM  Result Value Ref Range   Glucose-Capillary 98 65 - 99 mg/dL  I-Stat Chem 8, ED     Status: Abnormal  Collection Time: 09/01/16  5:57 PM  Result Value Ref Range   Sodium 137 135 - 145 mmol/L   Potassium 4.2 3.5 - 5.1 mmol/L   Chloride 107 101 - 111 mmol/L   BUN 24 (H) 6 - 20 mg/dL   Creatinine, Ser 1.30 (H) 0.44 - 1.00 mg/dL   Glucose, Bld 92 65 - 99 mg/dL   Calcium, Ion 1.00 (L) 1.15 - 1.40 mmol/L   TCO2 21 0 - 100 mmol/L   Hemoglobin 11.6 (L) 12.0 - 15.0 g/dL   HCT 34.0 (L) 36.0 - 46.0 %  VAS [place] LOWER EXTREMITY ARTERIAL DUPLEX     Status: None   Collection Time: 09/02/16 12:42 PM  Result Value Ref Range   Left super femoral prox sys PSV 473 cm/s   Left super  femoral mid sys PSV -24 cm/s   Left super femoral dist sys PSV -28 cm/s   Left popliteal prox sys PSV 79 cm/s   Left popliteal dist sys PSV 70 cm/s   Left ant tibial sys PSV 63 cm/s   Right super femoral prox sys PSV 155 cm/s   Right super femoral mid sys PSV -64 cm/s   Right super femoral dist sys PSV -72 cm/s   Right popliteal prox sys PSV 70 cm/s   Right popliteal dist sys PSV 84 cm/s   Right post tibial sys PSV 88 cm/s   Left ant tibial mid sys 30 cm/s   Left ant tibial distal sys 28 cm/s   left post tibial dist sys 6 cm/s   left post tibial mid sys 34 cm/s   LEFT PERO DIST SYS 31.00 cm/s   LEFT PERO MID SYS 36 cm/s   RIGHT ANT MID TIBIAL SYS PSV -18 cm/s   RIGHT ANT DIST TIBAL SYS PSV -54 cm/s   RIGHT POST TIB DIST SYS 54 cm/s   RIGHT POST TIB MID SYS 77 cm/s   RIGHT PERO MID SYS 61 cm/s   LEFT SFA PROX DYS VEL 162 cm/s    Radiology Ct Head Wo Contrast  Result Date: 09/01/2016 CLINICAL DATA:  Bilateral leg weakness and numbness history of fall EXAM: CT HEAD WITHOUT CONTRAST TECHNIQUE: Contiguous axial images were obtained from the base of the skull through the vertex without intravenous contrast. COMPARISON:  MRI 07/15/2015 FINDINGS: Brain: No acute territorial infarction or intracranial hemorrhage is visualized. Moderate severe periventricular and deep white matter hypodensities. Hypodensities within the bilateral thalamus and pons. Findings would be consistent with small vessel disease. No focal mass or midline shift. Mild atrophy. Stable ventricular size. Vascular: No hyperdense vessels.  Carotid artery calcifications. Skull: Mastoid air cells are clear.  No skull fracture is seen. Sinuses/Orbits: Mucosal thickening in the paranasal sinuses. No acute orbital abnormality. Other: Prominent degenerative changes at C1-C2 articulation. IMPRESSION: 1. No CT evidence for acute intracranial abnormality. 2. Moderate to marked periventricular and deep white matter hypodensities consistent  with small vessel disease. Electronically Signed   By: Donavan Foil M.D.   On: 09/01/2016 18:51   Dg Knee Complete 4 Views Left  Result Date: 09/01/2016 CLINICAL DATA:  Acute onset of left knee pain and swelling after injury. Initial encounter. EXAM: LEFT KNEE - COMPLETE 4+ VIEW COMPARISON:  Left knee radiographs performed 08/13/2016 FINDINGS: There is no evidence of fracture or dislocation. Marginal osteophytes are noted arising at the medial lateral compartments, and at the tibial spine. Wall osteophytes are also seen. There is mild narrowing of the patellofemoral compartment. Underlying chondrocalcinosis and calcification  of the menisci are noted. A fabella is seen. Trace knee joint fluid remains within normal limits. A vascular stent is noted along the distal thigh. IMPRESSION: 1. No evidence of fracture or dislocation. 2. Mild tricompartmental osteoarthritis at the left knee. 3. Underlying chondrocalcinosis and meniscal calcification noted. Electronically Signed   By: Garald Balding M.D.   On: 09/01/2016 22:24      Assessment/Plan  Hyperlipidemia lipid control important in reducing the progression of atherosclerotic disease. Continue statin therapy   Atherosclerosis of native arteries of extremity with intermittent claudication (HCC) The patient's noninvasive studies today demonstrate occlusion of her left SFA stent as well as what appears to be at least moderate if not high-grade stenosis in her right popliteal artery at the bottom edge or just below her previously placed SFA/popliteal stent. It would appear that this is the cause of her recurrent claudication symptoms. With her symptoms being near rest pain at this point, I think we need to proceed with intervention sooner rather than later. She will continue her aspirin, Plavix, and Lipitor. Risks and benefits of angiography with possible revascularization were discussed and the patient is agreeable to proceed.    Leotis Pain,  MD  09/29/2016 4:31 PM    This note was created with Dragon medical transcription system.  Any errors from dictation are purely unintentional

## 2016-10-03 ENCOUNTER — Encounter
Admission: RE | Admit: 2016-10-03 | Discharge: 2016-10-03 | Disposition: A | Discharge status: Home / Self Care | Payer: Medicare HMO | Source: Ambulatory Visit | Attending: Vascular Surgery | Admitting: Vascular Surgery

## 2016-10-03 DIAGNOSIS — N189 Chronic kidney disease, unspecified: Secondary | ICD-10-CM | POA: Diagnosis not present

## 2016-10-03 DIAGNOSIS — I70222 Atherosclerosis of native arteries of extremities with rest pain, left leg: Secondary | ICD-10-CM | POA: Diagnosis present

## 2016-10-03 DIAGNOSIS — E785 Hyperlipidemia, unspecified: Secondary | ICD-10-CM | POA: Diagnosis not present

## 2016-10-03 DIAGNOSIS — Z888 Allergy status to other drugs, medicaments and biological substances status: Secondary | ICD-10-CM | POA: Diagnosis not present

## 2016-10-03 DIAGNOSIS — I129 Hypertensive chronic kidney disease with stage 1 through stage 4 chronic kidney disease, or unspecified chronic kidney disease: Secondary | ICD-10-CM | POA: Diagnosis not present

## 2016-10-03 DIAGNOSIS — Z7982 Long term (current) use of aspirin: Secondary | ICD-10-CM | POA: Diagnosis not present

## 2016-10-03 DIAGNOSIS — M1990 Unspecified osteoarthritis, unspecified site: Secondary | ICD-10-CM | POA: Diagnosis not present

## 2016-10-03 DIAGNOSIS — F17210 Nicotine dependence, cigarettes, uncomplicated: Secondary | ICD-10-CM | POA: Diagnosis not present

## 2016-10-03 DIAGNOSIS — Z800 Family history of malignant neoplasm of digestive organs: Secondary | ICD-10-CM | POA: Diagnosis not present

## 2016-10-03 DIAGNOSIS — F329 Major depressive disorder, single episode, unspecified: Secondary | ICD-10-CM | POA: Diagnosis not present

## 2016-10-03 DIAGNOSIS — Z7902 Long term (current) use of antithrombotics/antiplatelets: Secondary | ICD-10-CM | POA: Diagnosis not present

## 2016-10-03 DIAGNOSIS — Z8249 Family history of ischemic heart disease and other diseases of the circulatory system: Secondary | ICD-10-CM | POA: Diagnosis not present

## 2016-10-03 DIAGNOSIS — Z90710 Acquired absence of both cervix and uterus: Secondary | ICD-10-CM | POA: Diagnosis not present

## 2016-10-03 DIAGNOSIS — Z885 Allergy status to narcotic agent status: Secondary | ICD-10-CM | POA: Diagnosis not present

## 2016-10-03 HISTORY — DX: Peripheral vascular disease, unspecified: I73.9

## 2016-10-03 NOTE — Patient Instructions (Addendum)
[facility]  Your procedure is scheduled with Dr Wyn Quakerew                          On:  Date:  10/04/16                  Time: 8:45 am     On arrival [facility] Your arrival time will be approximately 1-1.5 hours prior to you procedure start time. This allows time to check lab results and prep you for the procedure.  If your procedure time change you will be notified by the doctor's office.    _x____Do not eat or drink 8 hours prior to your procedure.    __x___Take all your morning medications with sips of water.  __x___Continue Plavix and/or Aspirin.  _____Take half of your bedtime Insulin.  _____Take half of  your morning Insulin.  _____Bring your current medications in their bottle with you.  _____Do not take Metformin 24 hours before procedure or 48 hours after you procedure.  _____Stop Pradaxa, Xarelto, Coumadin, Effient, or Eliquis for 3 days prior to your procedure.  Your recovery time will be 1-2 hours (1 hour for a  vein stick, 2 hours for an artery stick )  Please call Dr Wyn Quakerew and Dr Marijean HeathSchnier's office with any questions or concerns: [phone].  You will need to have someone drive you home and stay with you the night of the procedure.

## 2016-10-04 ENCOUNTER — Encounter: Payer: Self-pay | Admitting: *Deleted

## 2016-10-04 ENCOUNTER — Observation Stay
Admission: RE | Admit: 2016-10-04 | Discharge: 2016-10-05 | Disposition: A | Discharge status: Home / Self Care | Payer: Medicare HMO | Source: Ambulatory Visit | Attending: Vascular Surgery | Admitting: Vascular Surgery

## 2016-10-04 ENCOUNTER — Encounter
Admission: RE | Disposition: A | Discharge status: Home / Self Care | Payer: Self-pay | Source: Ambulatory Visit | Attending: Vascular Surgery

## 2016-10-04 DIAGNOSIS — Z90710 Acquired absence of both cervix and uterus: Secondary | ICD-10-CM | POA: Insufficient documentation

## 2016-10-04 DIAGNOSIS — Z800 Family history of malignant neoplasm of digestive organs: Secondary | ICD-10-CM | POA: Insufficient documentation

## 2016-10-04 DIAGNOSIS — F329 Major depressive disorder, single episode, unspecified: Secondary | ICD-10-CM | POA: Insufficient documentation

## 2016-10-04 DIAGNOSIS — I739 Peripheral vascular disease, unspecified: Secondary | ICD-10-CM | POA: Insufficient documentation

## 2016-10-04 DIAGNOSIS — I998 Other disorder of circulatory system: Secondary | ICD-10-CM | POA: Diagnosis present

## 2016-10-04 DIAGNOSIS — I70222 Atherosclerosis of native arteries of extremities with rest pain, left leg: Secondary | ICD-10-CM | POA: Diagnosis not present

## 2016-10-04 DIAGNOSIS — Z8249 Family history of ischemic heart disease and other diseases of the circulatory system: Secondary | ICD-10-CM | POA: Insufficient documentation

## 2016-10-04 DIAGNOSIS — E785 Hyperlipidemia, unspecified: Secondary | ICD-10-CM | POA: Insufficient documentation

## 2016-10-04 DIAGNOSIS — Z885 Allergy status to narcotic agent status: Secondary | ICD-10-CM | POA: Insufficient documentation

## 2016-10-04 DIAGNOSIS — N189 Chronic kidney disease, unspecified: Secondary | ICD-10-CM | POA: Insufficient documentation

## 2016-10-04 DIAGNOSIS — F17210 Nicotine dependence, cigarettes, uncomplicated: Secondary | ICD-10-CM | POA: Insufficient documentation

## 2016-10-04 DIAGNOSIS — I129 Hypertensive chronic kidney disease with stage 1 through stage 4 chronic kidney disease, or unspecified chronic kidney disease: Secondary | ICD-10-CM | POA: Insufficient documentation

## 2016-10-04 DIAGNOSIS — M1990 Unspecified osteoarthritis, unspecified site: Secondary | ICD-10-CM | POA: Insufficient documentation

## 2016-10-04 DIAGNOSIS — Z888 Allergy status to other drugs, medicaments and biological substances status: Secondary | ICD-10-CM | POA: Insufficient documentation

## 2016-10-04 DIAGNOSIS — Z7982 Long term (current) use of aspirin: Secondary | ICD-10-CM | POA: Insufficient documentation

## 2016-10-04 DIAGNOSIS — Z7902 Long term (current) use of antithrombotics/antiplatelets: Secondary | ICD-10-CM | POA: Insufficient documentation

## 2016-10-04 HISTORY — PX: PERIPHERAL VASCULAR CATHETERIZATION: SHX172C

## 2016-10-04 LAB — CREATININE, SERUM
Creatinine, Ser: 1.14 mg/dL — ABNORMAL HIGH (ref 0.44–1.00)
GFR calc Af Amer: 51 mL/min — ABNORMAL LOW (ref >60)
GFR, EST NON AFRICAN AMERICAN: 44 mL/min — AB (ref >60)

## 2016-10-04 LAB — BUN: BUN: 31 mg/dL — ABNORMAL HIGH (ref 6–20)

## 2016-10-04 SURGERY — LOWER EXTREMITY ANGIOGRAPHY
Anesthesia: Moderate Sedation | Site: Leg Lower | Laterality: Left

## 2016-10-04 MED ORDER — IOPAMIDOL (ISOVUE-300) INJECTION 61%
INTRAVENOUS | Status: DC | PRN
  Administered 2016-10-04: 75 mL via INTRAVENOUS

## 2016-10-04 MED ORDER — TIROFIBAN HCL IN NACL 5-0.9 MG/100ML-% IV SOLN
0.0750 ug/kg/min | INTRAVENOUS | Status: DC
  Administered 2016-10-04 (×2): 0.075 ug/kg/min via INTRAVENOUS
  Filled 2016-10-04 (×4): qty 100

## 2016-10-04 MED ORDER — METOPROLOL TARTRATE 50 MG PO TABS
50.0000 mg | ORAL_TABLET | Freq: Every day | ORAL | Status: DC
  Administered 2016-10-04 – 2016-10-05 (×2): 50 mg via ORAL
  Filled 2016-10-04 (×2): qty 1

## 2016-10-04 MED ORDER — CEFAZOLIN IN D5W 1 GM/50ML IV SOLN
1.0000 g | Freq: Once | INTRAVENOUS | Status: DC
  Administered 2016-10-04: 1 g via INTRAVENOUS

## 2016-10-04 MED ORDER — SODIUM CHLORIDE 0.9 % IJ SOLN
INTRAMUSCULAR | Status: AC
Start: 2016-10-04 — End: 2016-10-04
  Filled 2016-10-04: qty 50

## 2016-10-04 MED ORDER — MONTELUKAST SODIUM 10 MG PO TABS
10.0000 mg | ORAL_TABLET | Freq: Every day | ORAL | Status: DC
  Administered 2016-10-04: 10 mg via ORAL
  Filled 2016-10-04: qty 1

## 2016-10-04 MED ORDER — ACETAMINOPHEN 325 MG PO TABS
650.0000 mg | ORAL_TABLET | Freq: Four times a day (QID) | ORAL | Status: DC | PRN
  Administered 2016-10-04: 650 mg via ORAL
  Filled 2016-10-04: qty 2

## 2016-10-04 MED ORDER — FENTANYL CITRATE (PF) 100 MCG/2ML IJ SOLN
INTRAMUSCULAR | Status: AC
  Filled 2016-10-04: qty 2

## 2016-10-04 MED ORDER — CLOPIDOGREL BISULFATE 75 MG PO TABS
75.0000 mg | ORAL_TABLET | Freq: Every day | ORAL | Status: DC
  Administered 2016-10-04 – 2016-10-05 (×2): 75 mg via ORAL
  Filled 2016-10-04 (×2): qty 1

## 2016-10-04 MED ORDER — GABAPENTIN 100 MG PO CAPS
100.0000 mg | ORAL_CAPSULE | Freq: Every day | ORAL | Status: DC
  Filled 2016-10-04: qty 1

## 2016-10-04 MED ORDER — METOPROLOL-HYDROCHLOROTHIAZIDE 50-25 MG PO TABS: 1.0000 | ORAL_TABLET | Freq: Every day | ORAL | Status: DC

## 2016-10-04 MED ORDER — HYDROMORPHONE HCL 1 MG/ML IJ SOLN: 1.0000 mg | INTRAMUSCULAR | Status: DC | PRN

## 2016-10-04 MED ORDER — ONDANSETRON HCL 4 MG/2ML IJ SOLN: 4.0000 mg | INTRAMUSCULAR | Status: DC | PRN

## 2016-10-04 MED ORDER — FENTANYL CITRATE (PF) 100 MCG/2ML IJ SOLN
INTRAMUSCULAR | Status: DC | PRN
  Administered 2016-10-04 (×3): 25 ug via INTRAVENOUS
  Administered 2016-10-04: 50 ug via INTRAVENOUS

## 2016-10-04 MED ORDER — POLYVINYL ALCOHOL 1.4 % OP SOLN
1.0000 [drp] | Freq: Three times a day (TID) | OPHTHALMIC | Status: DC | PRN
  Filled 2016-10-04: qty 15

## 2016-10-04 MED ORDER — ASPIRIN EC 81 MG PO TBEC
81.0000 mg | DELAYED_RELEASE_TABLET | Freq: Every day | ORAL | Status: DC
  Administered 2016-10-04 – 2016-10-05 (×2): 81 mg via ORAL
  Filled 2016-10-04 (×2): qty 1

## 2016-10-04 MED ORDER — MIDAZOLAM HCL 2 MG/2ML IJ SOLN
INTRAMUSCULAR | Status: AC
  Filled 2016-10-04: qty 2

## 2016-10-04 MED ORDER — FERROUS SULFATE 325 (65 FE) MG PO TABS
325.0000 mg | ORAL_TABLET | Freq: Every day | ORAL | Status: DC
  Administered 2016-10-04: 325 mg via ORAL
  Filled 2016-10-04: qty 1

## 2016-10-04 MED ORDER — SORBITOL 70 % SOLN
30.0000 mL | Freq: Every day | Status: DC | PRN
  Filled 2016-10-04: qty 30

## 2016-10-04 MED ORDER — MAGNESIUM HYDROXIDE 400 MG/5ML PO SUSP: 30.0000 mL | Freq: Every day | ORAL | Status: DC | PRN

## 2016-10-04 MED ORDER — HEPARIN SODIUM (PORCINE) 10000 UNIT/ML IJ SOLN
INTRAMUSCULAR | Status: AC
  Filled 2016-10-04: qty 1

## 2016-10-04 MED ORDER — DEXTROSE-NACL 5-0.9 % IV SOLN
INTRAVENOUS | Status: DC
  Administered 2016-10-04 – 2016-10-05 (×2): via INTRAVENOUS

## 2016-10-04 MED ORDER — SODIUM CHLORIDE 0.9% FLUSH: 3.0000 mL | Freq: Two times a day (BID) | INTRAVENOUS | Status: DC

## 2016-10-04 MED ORDER — FLEET ENEMA 7-19 GM/118ML RE ENEM: 1.0000 | ENEMA | Freq: Once | RECTAL | Status: DC | PRN

## 2016-10-04 MED ORDER — ALTEPLASE 2 MG IJ SOLR
INTRAMUSCULAR | Status: DC | PRN
Start: 2016-10-04 — End: 2016-10-04
  Administered 2016-10-04: 8 mg

## 2016-10-04 MED ORDER — HYDROCHLOROTHIAZIDE 25 MG PO TABS
25.0000 mg | ORAL_TABLET | Freq: Every day | ORAL | Status: DC
  Administered 2016-10-04 – 2016-10-05 (×2): 25 mg via ORAL
  Filled 2016-10-04 (×2): qty 1

## 2016-10-04 MED ORDER — ONDANSETRON HCL 4 MG/2ML IJ SOLN: 4.0000 mg | Freq: Four times a day (QID) | INTRAMUSCULAR | Status: DC | PRN

## 2016-10-04 MED ORDER — ACETAMINOPHEN 650 MG RE SUPP
650.0000 mg | Freq: Four times a day (QID) | RECTAL | Status: DC | PRN
  Filled 2016-10-04: qty 1

## 2016-10-04 MED ORDER — MECLIZINE HCL 25 MG PO TABS: 25.0000 mg | ORAL_TABLET | Freq: Three times a day (TID) | ORAL | Status: DC | PRN

## 2016-10-04 MED ORDER — NITROGLYCERIN 1 MG/10 ML FOR IR/CATH LAB
INTRA_ARTERIAL | Status: DC | PRN
  Administered 2016-10-04: 500 ug via INTRA_ARTERIAL
  Administered 2016-10-04: 250 ug via INTRA_ARTERIAL

## 2016-10-04 MED ORDER — MIDAZOLAM HCL 2 MG/2ML IJ SOLN
INTRAMUSCULAR | Status: AC
  Filled 2016-10-04: qty 4

## 2016-10-04 MED ORDER — ATORVASTATIN CALCIUM 20 MG PO TABS
20.0000 mg | ORAL_TABLET | Freq: Every day | ORAL | Status: DC
  Administered 2016-10-04 – 2016-10-05 (×2): 20 mg via ORAL
  Filled 2016-10-04 (×2): qty 1

## 2016-10-04 MED ORDER — TIROFIBAN (AGGRASTAT) BOLUS VIA INFUSION
25.0000 ug/kg | Freq: Once | INTRAVENOUS | Status: DC
  Filled 2016-10-04: qty 41

## 2016-10-04 MED ORDER — DOCUSATE SODIUM 100 MG PO CAPS
100.0000 mg | ORAL_CAPSULE | Freq: Two times a day (BID) | ORAL | Status: DC
  Administered 2016-10-05: 100 mg via ORAL
  Filled 2016-10-04 (×2): qty 1

## 2016-10-04 MED ORDER — ESCITALOPRAM OXALATE 10 MG PO TABS
20.0000 mg | ORAL_TABLET | Freq: Every day | ORAL | Status: DC
  Administered 2016-10-04: 20 mg via ORAL
  Filled 2016-10-04: qty 1
  Filled 2016-10-04: qty 2

## 2016-10-04 MED ORDER — MIDAZOLAM HCL 2 MG/2ML IJ SOLN
INTRAMUSCULAR | Status: DC | PRN
  Administered 2016-10-04 (×2): 1 mg via INTRAVENOUS
  Administered 2016-10-04: 2 mg via INTRAVENOUS
  Administered 2016-10-04: 1 mg via INTRAVENOUS

## 2016-10-04 MED ORDER — SODIUM CHLORIDE 0.9 % IV SOLN
INTRAVENOUS | Status: DC
  Administered 2016-10-04: 10:00:00 via INTRAVENOUS

## 2016-10-04 MED ORDER — ONDANSETRON HCL 4 MG PO TABS
4.0000 mg | ORAL_TABLET | Freq: Four times a day (QID) | ORAL | Status: DC | PRN
  Filled 2016-10-04: qty 1

## 2016-10-04 MED ORDER — ALTEPLASE 2 MG IJ SOLR
INTRAMUSCULAR | Status: AC
  Filled 2016-10-04: qty 8

## 2016-10-04 MED ORDER — HEPARIN SODIUM (PORCINE) 1000 UNIT/ML IJ SOLN
INTRAMUSCULAR | Status: DC | PRN
  Administered 2016-10-04: 5000 [IU] via INTRAVENOUS

## 2016-10-04 MED ORDER — TRAMADOL HCL 50 MG PO TABS: 50.0000 mg | ORAL_TABLET | Freq: Four times a day (QID) | ORAL | Status: DC | PRN

## 2016-10-04 MED ORDER — NITROGLYCERIN 5 MG/ML IV SOLN
INTRAVENOUS | Status: AC
  Filled 2016-10-04: qty 10

## 2016-10-04 MED ORDER — TIROFIBAN HCL IN NACL 5-0.9 MG/100ML-% IV SOLN
INTRAVENOUS | Status: AC
  Filled 2016-10-04: qty 100

## 2016-10-04 MED ORDER — TIROFIBAN HCL IV 12.5 MG/250 ML
INTRAVENOUS | Status: AC
  Filled 2016-10-04: qty 250

## 2016-10-04 MED ORDER — HEPARIN SODIUM (PORCINE) 1000 UNIT/ML IJ SOLN
INTRAMUSCULAR | Status: AC
  Filled 2016-10-04: qty 1

## 2016-10-04 MED ORDER — ALBUTEROL SULFATE (2.5 MG/3ML) 0.083% IN NEBU: 2.5000 mg | INHALATION_SOLUTION | Freq: Four times a day (QID) | RESPIRATORY_TRACT | Status: DC | PRN

## 2016-10-04 MED ORDER — LIDOCAINE HCL (PF) 1 % IJ SOLN
INTRAMUSCULAR | Status: AC
  Filled 2016-10-04: qty 5

## 2016-10-04 SURGICAL SUPPLY — 25 items
BALLN LUTONIX 5X150X130 (BALLOONS) ×8
BALLN ULTRVRSE 3X220X150 (BALLOONS) ×4
BALLN ULTRVRSE 3X300X150 (BALLOONS) ×2
BALLN ULTRVRSE 3X300X150 OTW (BALLOONS) ×2
BALLOON LUTONIX 5X150X130 (BALLOONS) ×4 IMPLANT
BALLOON ULTRVRSE 3X220X150 (BALLOONS) ×2 IMPLANT
BALLOON ULTRVRSE 3X300X150 OTW (BALLOONS) ×2 IMPLANT
CANISTER PENUMBRA MAX (MISCELLANEOUS) ×4 IMPLANT
CATH INDIGO 6 ST-TIP 135CM (CATHETERS) ×4 IMPLANT
CATH INDIGO SEP 6 (CATHETERS) ×4 IMPLANT
CATH PIG 70CM (CATHETERS) ×4 IMPLANT
CATH VERT 100CM (CATHETERS) ×8 IMPLANT
DEVICE PRESTO INFLATION (MISCELLANEOUS) ×4 IMPLANT
DEVICE SOLENT OMNI 120CM (CATHETERS) ×4 IMPLANT
DEVICE STARCLOSE SE CLOSURE (Vascular Products) ×4 IMPLANT
GLIDEWIRE ADV .035X260CM (WIRE) ×4 IMPLANT
PACK ANGIOGRAPHY (CUSTOM PROCEDURE TRAY) ×4 IMPLANT
SHEATH ANL2 6FRX45 HC (SHEATH) ×4 IMPLANT
SHEATH BRITE TIP 5FRX11 (SHEATH) ×4 IMPLANT
STENT VIABAHN 6X250X120 (Permanent Stent) ×4 IMPLANT
SYR MEDRAD MARK V 150ML (SYRINGE) ×4 IMPLANT
TUBING ASPIRATION INDIGO (MISCELLANEOUS) ×4 IMPLANT
TUBING CONTRAST HIGH PRESS 72 (TUBING) ×4 IMPLANT
WIRE G V18X300CM (WIRE) ×4 IMPLANT
WIRE J 3MM .035X145CM (WIRE) ×4 IMPLANT

## 2016-10-04 NOTE — Progress Notes (Signed)
DR Wyn Quakerew was made aware of pt slight oozing blood from right groin site with PAD inplace and inflated , stated to leave it inflated till am , and to continue to monitor .

## 2016-10-04 NOTE — H&P (Signed)
[facility] History & Physical Update  The patient was interviewed and re-examined.  The patient's previous History and Physical has been reviewed and is unchanged.  There is no change in the plan of care. We plan to proceed with the scheduled procedure.  Nancy BarrenJason Dew, MD  10/04/2016, 9:27 AM

## 2016-10-04 NOTE — Op Note (Signed)
[facility] Percutaneous Study/Intervention Procedural Note   Date of Surgery: 10/04/2016  Surgeon(s):Traci Gafford   Assistants:none  Pre-operative Diagnosis: PAD with rest pain left lower extremity  Post-operative diagnosis: Same  Procedure(s) Performed: 1. Ultrasound guidance for vascular access right femoral artery 2. Catheter placement into left peroneal artery from right femoral approach 3. Aortogram and selective left lower extremity angiogram 4. Catheter directed thrombolytic therapy to the left SFA, popliteal artery, tibioperoneal trunk, and peroneal arteries with 8 mg of TPA delivered with the AngioJet Omni catheter and a power pulse spray fashion 5. Mechanical rheolytic thrombectomy to the left SFA, popliteal artery, tibioperoneal trunk, and peroneal arteries with the AngioJet Omni catheter as well as mechanical thrombectomy to the left popliteal artery, tibioperoneal trunk, and peroneal arteries with the penumbra CAT 6 catheter  6.  Percutaneous transluminal angioplasty of the left tibioperoneal trunk and peroneal artery to the distal segment with 3 mm diameter by 30 cm length angioplasty balloon  7.  Percutaneous transluminal edge plasty of the left SFA and popliteal arteries with 5 mm diameter angioplasty balloon including 2 inflations with a Lutonix drug-coated angioplasty balloon  8.  Viabahn covered stent placement to the left mid to distal SFA and above-knee popliteal artery with 6 mm diameter by 25 cm length covered stent for residual stenosis and thrombosis after angioplasty 9. StarClose closure device right femoral artery  EBL: 200 cc  Contrast: 75 cc  Fluoro Time: 13.8 minutes  Moderate Conscious Sedation Time: approximately 60 minutes using 5 mg of Versed and 125 mcg of Fentanyl  Indications: Patient is a 83 y.o.female with recurrent LLE ischemia  with rest pain. The patient has noninvasive study showing occlusion of her previous SFA stent. The patient is brought in for angiography for further evaluation and potential treatment. Risks and benefits are discussed and informed consent is obtained  Procedure: The patient was identified and appropriate procedural time out was performed. The patient was then placed supine on the table and prepped and draped in the usual sterile fashion.Moderate conscious sedation was administered during a face to face encounter with the patient throughout the procedure with my supervision of the RN administering medicines and monitoring the patient's vital signs, pulse oximetry, telemetry and mental status throughout from the start of the procedure until the patient was taken to the recovery room. Ultrasound was used to evaluate the right common femoral artery. It was patent . A digital ultrasound image was acquired. A Seldinger needle was used to access the right common femoral artery under direct ultrasound guidance and a permanent image was performed. A 0.035 J wire was advanced without resistance and a 5Fr sheath was placed. Pigtail catheter was placed into the aorta and an AP aortogram was performed. This demonstrated at least moderate left renal artery stenosis, mild right renal artery stenosis.  The aorta and iliac arteries appeared to have no significant stenosis. I then crossed the aortic bifurcation and advanced to the left femoral head. Selective left lower extremity angiogram was then performed. This demonstrated occlusion of the SFA about 2-3 cm beyond its origin and occlusion of the previous SFA stent. There appeared to be some faint reconstitution in the popliteal artery, but then there was further occlusion and what appeared to be the distal popliteal artery and tibioperoneal trunk with only reconstitution of the peroneal artery in the mid to distal segment identified as runoff to the foot. The patient was  systemically heparinized and a 6 [place] Ansell sheath was then placed  over the [employer] wire. I then used a Kumpe catheter and the advantage wire to cross the occlusion. This was crossed without any difficulty at all consistent with fresher thrombus. I confirmed intraluminal flow in the peroneal artery with a Kumpe catheter and then replaced a 0.018 wire. With this clearly being a acute on chronic situation I elected to place 8 mg of TPA and a power pulse spray fashion from the origin of the SFA through the popliteal artery and tibioperoneal trunk and down into the proximal peroneal artery. This was allowed to dwell for approximately 15 minutes. Mechanical rheolytic thrombectomy was then performed with the AngioJet Omni catheter to incorporate the left SFA, popliteal artery, tibioperoneal trunk, and peroneal artery in the proximal to mid segment. Proximally 251 cc of effluent were returned. After this, flow was still poor and multiple areas residual thrombus and stenosis were identified. I then began treating with angioplasty. The peroneal artery and tibioperoneal trunk were treated with a 3 mm diameter by 30 cm length angioplasty balloon inflated to 10 atm for 1 minute from the mid to distal peroneal artery up through the tibioperoneal trunk. For the popliteal artery, a 5 mm diameter by 15 cm length Lutonix drug-coated angioplasty balloon was selected and inflated to 12 atm for 1 minute. A second inflation in the distal SFA and the previously placed stent was then performed with the same 5 x 15 balloon. This inflation was 12 atm for 1 minute as well. A new 5 mm diameter by 15 cm length Lutonix drug-coated angioplasty balloon was then used to treat the proximal SFA at the site of the initial occlusion. This was also inflated to 12 atm for 1 minute. Completion angiogram showed the proximal SFA to be patent but in the mid SFA just above the previous placed stent was a greater than 50% residual stenosis. The  stents had some mild residual thrombus and just below the previously placed stent was thrombus and residual stenosis of greater than 80%. I elected to cover this area with a 6 mm diameter by 25 cm length Viabahn stent in the mid to distal SFA and above-knee popliteal artery. This was postdilated with a 5 mm balloon with excellent angiographic result and less than 20% residual stenosis in the SFA and popliteal arteries. There was still residual thrombus and stenosis in the tibioperoneal trunk and peroneal arteries. I elected to exchange for the penumbra aspiration device. The CAT 6 catheter was selected and used in the below-knee popliteal artery, tibioperoneal trunk, and peroneal artery. Several passes were made and about 200 cc of blood and thrombus were returned in total. This resulted in significant improvement with only mild residual thrombus creating about a 30% residual stenosis in the tibioperoneal trunk and popliteal artery. There was still flow limiting occlusive thrombus and stenosis in the mid and now into the distal peroneal artery. I then selected a 3 mm diameter by 30 cm length angioplasty balloon inflated from the distal peroneal artery up through the tibioperoneal trunk. This was inflated to 8 atm for 1 minute. Completion and she gram showed about a 40-50% residual stenosis with thrombus in the mid to distal peroneal artery but much more brisk flow into the foot. I did not feel that was anything further we can do from a percutaneous standpoint today, and would treat her with Aggrastat overnight. I elected to terminate the procedure. The sheath was removed and StarClose closure device was deployed in the right femoral artery with excellent hemostatic  result. The patient was taken to the recovery room in stable condition having tolerated the procedure well.  Findings:  Aortogram: at least moderate left renal artery stenosis, mild right renal artery stenosis.  The aorta and iliac  arteries appeared to have no significant stenosis. Left Lower Extremity: occlusion of the SFA about 2-3 cm beyond its origin and occlusion of the previous SFA stent. There appeared to be some faint reconstitution in the popliteal artery, but then there was further occlusion and what appeared to be the distal popliteal artery and tibioperoneal trunk with only reconstitution of the peroneal artery in the mid to distal segment identified as runoff to the foot   Disposition: Patient was taken to the recovery room in stable condition having tolerated the procedure well.  Complications: None  Leotis Pain 10/04/2016 1:53 PM   This note was created with Dragon Medical transcription system. Any errors in dictation are purely unintentional.

## 2016-10-05 ENCOUNTER — Encounter: Payer: Self-pay | Admitting: Vascular Surgery

## 2016-10-05 DIAGNOSIS — I70222 Atherosclerosis of native arteries of extremities with rest pain, left leg: Secondary | ICD-10-CM | POA: Diagnosis not present

## 2016-10-05 LAB — BASIC METABOLIC PANEL
ANION GAP: 6 (ref 5–15)
BUN: 27 mg/dL — ABNORMAL HIGH (ref 6–20)
CHLORIDE: 112 mmol/L — AB (ref 101–111)
CO2: 21 mmol/L — ABNORMAL LOW (ref 22–32)
Calcium: 8.3 mg/dL — ABNORMAL LOW (ref 8.9–10.3)
Creatinine, Ser: 0.89 mg/dL (ref 0.44–1.00)
GFR calc Af Amer: >60 mL/min (ref >60)
GFR, EST NON AFRICAN AMERICAN: 60 mL/min — AB (ref >60)
GLUCOSE: 118 mg/dL — AB (ref 65–99)
POTASSIUM: 4.2 mmol/L (ref 3.5–5.1)
SODIUM: 139 mmol/L (ref 135–145)

## 2016-10-05 LAB — CBC
HEMATOCRIT: 26 % — AB (ref 35.0–47.0)
HEMOGLOBIN: 8.7 g/dL — AB (ref 12.0–16.0)
MCH: 32.5 pg (ref 26.0–34.0)
MCHC: 33.6 g/dL (ref 32.0–36.0)
MCV: 96.7 fL (ref 80.0–100.0)
Platelets: 126 10*3/uL — ABNORMAL LOW (ref 150–440)
RBC: 2.69 MIL/uL — ABNORMAL LOW (ref 3.80–5.20)
RDW: 14.6 % — ABNORMAL HIGH (ref 11.5–14.5)
WBC: 4.7 10*3/uL (ref 3.6–11.0)

## 2016-10-05 MED ORDER — TRAMADOL HCL 50 MG PO TABS: 50.0000 mg | ORAL_TABLET | Freq: Four times a day (QID) | ORAL | 1 refills | Status: AC | PRN

## 2016-10-05 MED ORDER — ENSURE ENLIVE PO LIQD: 237.0000 mL | Freq: Two times a day (BID) | ORAL | Status: DC

## 2016-10-05 NOTE — Discharge Summary (Signed)
[facility]    Discharge Summary    Patient ID:  Nancy DollyDelores R Novello MRN: [id number] DOB/AGE: [id number] y.o.  Admit date: 10/04/2016 Discharge date: 10/05/2016 Date of Surgery: 10/04/2016 Surgeon: Surgeon(s): Annice NeedyJason S Ricky Gallery, MD  Admission Diagnosis: LT leg angio     ASO w claudication   Discharge Diagnoses:  LT leg angio     ASO w claudication   Secondary Diagnoses: Past Medical History:  Diagnosis Date  . Arthritis   . Asthma   . Chronic kidney disease   . Depression   . Hypertension   . Peripheral vascular disease (HCC)     Procedure(s): Lower Extremity Angiography Lower Extremity Intervention  Discharged Condition: good  HPI:  Brought in for left leg revascularization.  Kept for [place] overnight and did well.    Hospital Course:  Nancy Larsen is a 83 y.o. female is S/P Left Procedure(s): Lower Extremity Angiography Lower Extremity Intervention Extubated: NA Physical exam: left foot warm, 1+ Left pedal pulse present Post-op wounds clean, dry, intact or healing well Pt. Ambulating, voiding and taking PO diet without difficulty. Pt pain controlled with PO pain meds. Labs as below Complications:none  Consults:    Significant Diagnostic Studies: CBC Lab Results  Component Value Date   WBC 4.7 10/05/2016   HGB 8.7 (L) 10/05/2016   HCT 26.0 (L) 10/05/2016   MCV 96.7 10/05/2016   PLT 126 (L) 10/05/2016    BMET    Component Value Date/Time   NA 139 10/05/2016 0436   NA 142 11/11/2015 1505   K 4.2 10/05/2016 0436   CL 112 (H) 10/05/2016 0436   CO2 21 (L) 10/05/2016 0436   GLUCOSE 118 (H) 10/05/2016 0436   BUN 27 (H) 10/05/2016 0436   BUN 19 11/11/2015 1505   CREATININE 0.89 10/05/2016 0436   CALCIUM 8.3 (L) 10/05/2016 0436   GFRNONAA 60 (L) 10/05/2016 0436   GFRAA >60 10/05/2016 0436   COAG Lab Results  Component Value Date   INR 1.07 09/01/2016     Disposition:  Discharge to :Home Discharge Instructions     Call MD for:  redness, tenderness, or signs of infection (pain, swelling, bleeding, redness, odor or green/yellow discharge around incision site)    Complete by:  As directed    Call MD for:  severe or increased pain, loss or decreased feeling  in affected limb(s)    Complete by:  As directed    Call MD for:  temperature >100.5    Complete by:  As directed    Driving Restrictions    Complete by:  As directed    No driving for 72 hours   No dressing needed    Complete by:  As directed    Replace only if drainage present   Resume previous diet    Complete by:  As directed      Allergies as of 10/05/2016      Reactions   Codeine Other (See Comments)   Dysuria   Symbicort [budesonide-formoterol Fumarate] Other (See Comments)   Made patient jittery      Medication List    TAKE these medications   aspirin EC 81 MG tablet Take 1 tablet (81 mg total) by mouth daily.   atorvastatin 20 MG tablet Commonly known as:  LIPITOR Take 1 tablet (20 mg total) by mouth daily.   clopidogrel 75 MG tablet Commonly known as:  PLAVIX Take 1 tablet (75 mg total) by mouth daily.  escitalopram 20 MG tablet Commonly known as:  LEXAPRO Take 20 mg by mouth at bedtime.   ferrous sulfate 325 (65 FE) MG tablet Take 325 mg by mouth at bedtime.   gabapentin 100 MG capsule Commonly known as:  NEURONTIN Take 100 mg by mouth at bedtime.   meclizine 25 MG tablet Commonly known as:  ANTIVERT Take 25 mg by mouth every 8 (eight) hours as needed for dizziness.   metoprolol-hydrochlorothiazide 50-25 MG tablet Commonly known as:  LOPRESSOR HCT Take 1 tablet by mouth at bedtime.   montelukast 10 MG tablet Commonly known as:  SINGULAIR Take 10 mg by mouth at bedtime.   SYSTANE BALANCE 0.6 % Soln Generic drug:  Propylene Glycol Place 1-2 drops into both eyes 3 (three) times daily as needed (for itchiness or irritation).   traMADol 50 MG tablet Commonly known as:  ULTRAM Take 1 tablet (50 mg  total) by mouth every 6 (six) hours as needed for moderate pain.   VENTOLIN HFA 108 (90 Base) MCG/ACT inhaler Generic drug:  albuterol Inhale 2 puffs into the lungs every 6 (six) hours as needed for wheezing or shortness of breath. FOR SEASONAL ALLERGIES      Verbal and written Discharge instructions given to the patient. Wound care per Discharge AVS Follow-up Information    Festus Barren, MD Follow up in 2 week(s).   Specialties:  Vascular Surgery, Radiology, Interventional Cardiology Contact information: [address] [place] [address] [phone]           Signed: Festus Barren, MD  10/05/2016, 3:08 PM

## 2016-10-05 NOTE — Care Management Obs Status (Signed)
MEDICARE OBSERVATION STATUS NOTIFICATION   Patient Details  Name: Nancy Larsen MRN: [id number] Date of Birth: 1934-06-03   Medicare Observation Status Notification Given:  Yes    Eber HongGreene, Rheanne Cortopassi R, RN 10/05/2016, 3:37 PM

## 2016-10-05 NOTE — Progress Notes (Signed)
Initial Nutrition Assessment  DOCUMENTATION CODES:   Not applicable  INTERVENTION:  1. Ensure Enlive po BID, each supplement provides 350 kcal and 20 grams of protein  NUTRITION DIAGNOSIS:   Inadequate oral intake related to poor appetite as evidenced by per patient/family report.  GOAL:   Patient will meet greater than or equal to 90% of their needs  MONITOR:   PO intake, I & O's, Labs, Weight trends, Supplement acceptance  REASON FOR ASSESSMENT:   Malnutrition Screening Tool    ASSESSMENT:   Patient returns today in follow up of Lower extremity pain and weakness. She reports this has progressed over the past several weeks.  Spoke with pt at bedside. She reports appetite as "not well." At home she ate "whatever was easy and convenient, whatever I want." She was ambiguous in describing volume or frequency of meals, but suspect PO intake not meeting needs given 16#/8.4% insignificant wt loss over 9 months - 9 pounds of which has been lost since November. Documented PO thus far has been 80-100% Denies any nausea/vomiting. Reports some occasional choking - but no other chewing/swallowing concerns.  Nutrition-Focused physical exam completed. Findings are no fat depletion, no muscle depletion, and no edema.   Labs and medications reviewed: Colace, Iron D5 @ 5875mL/hr --> 306 calories  Diet Order:  Diet heart healthy/carb modified Room service appropriate? Yes; Fluid consistency: Thin  Skin:  Reviewed, no issues  Last BM:  10/01/2016  Height:   Ht Readings from Last 1 Encounters:  10/04/16 5\' 6"  (1.676 m)    Weight:   Wt Readings from Last 1 Encounters:  10/05/16 175 lb 4.8 oz (79.5 kg)    Ideal Body Weight:  59.09 kg  BMI:  Body mass index is 28.29 kg/m.  Estimated Nutritional Needs:   Kcal:  1415-1587 (MSJ x1.1-1.2)  Protein:  80-95 gm  Fluid:  >/= 1.4L  EDUCATION NEEDS:   No education needs identified at this time  Dionne AnoWilliam M. Markian Glockner, MS, RD  LDN Inpatient Clinical Dietitian Pager [phone]

## 2016-10-05 NOTE — Care Management (Signed)
No cost prohibitive anticoagulants.  Per attending, there are no discharge needs.

## 2016-10-05 NOTE — Progress Notes (Signed)
Patient is discharge home in a stable condition , right groin site good , summary and f/u care given to both pt and family , verbalized understanding , foley cath discontinue as order , left with son

## 2016-10-11 ENCOUNTER — Telehealth: Payer: Self-pay | Admitting: Vascular Surgery

## 2016-10-11 NOTE — Telephone Encounter (Signed)
-----   Message from Maeola HarmanBrandon Christopher Cain, MD sent at 10/04/2016  5:39 PM EST ----- Regarding: RE: CTA She can be cancelled from my schedule since she is under the care of Dr. Wyn Quakerew.   Apolinar Junesbrandon ----- Message ----- From: Jena GaussMichele A Roczniak Sent: 10/04/2016   3:37 PM To: Maeola HarmanBrandon Christopher Cain, MD Subject: Annell GreeningFW: CTA                                        Dr. Randie Heinzain,  Please advise if this pt still needs a CTA and MD follow up.  Thank you, Elon JesterMichele  ----- Message ----- From: Kateri Mceborah S Thomas Sent: 10/04/2016   2:48 PM To: Jena GaussMichele A Roczniak Subject: CTA                                            Elon JesterMichele, please check with Dr. Randie Heinzain about this CTA.  The patient had an angiography today by Dr. Wyn Quakerew. It looks like she is his patient and he is taking care of her.

## 2016-10-11 NOTE — Telephone Encounter (Signed)
Cancelled CTA on 10/16/16 and PA f/u on 10/27/16. Spoke to pt's daughter to confirm cancellations.

## 2016-10-16 ENCOUNTER — Other Ambulatory Visit: Payer: Medicare HMO

## 2016-10-17 ENCOUNTER — Ambulatory Visit (INDEPENDENT_AMBULATORY_CARE_PROVIDER_SITE_OTHER): Payer: Medicare HMO | Admitting: Vascular Surgery

## 2016-10-17 ENCOUNTER — Encounter (INDEPENDENT_AMBULATORY_CARE_PROVIDER_SITE_OTHER): Payer: Self-pay | Admitting: Vascular Surgery

## 2016-10-17 VITALS — BP 106/62 | HR 110 | Resp 16 | Ht 64.0 in | Wt 170.0 lb

## 2016-10-17 DIAGNOSIS — I70213 Atherosclerosis of native arteries of extremities with intermittent claudication, bilateral legs: Secondary | ICD-10-CM | POA: Diagnosis not present

## 2016-10-17 DIAGNOSIS — E785 Hyperlipidemia, unspecified: Secondary | ICD-10-CM | POA: Diagnosis not present

## 2016-10-17 NOTE — Assessment & Plan Note (Signed)
lipid control important in reducing the progression of atherosclerotic disease. Continue statin therapy  

## 2016-10-17 NOTE — Progress Notes (Signed)
MRN : [id number]  Nancy Larsen is a 83 y.o. (1934-02-16) female who presents with chief complaint of  Chief Complaint  Patient presents with  . Re-evaluation    Post angio follow up  .  History of Present Illness: Patient returns today in follow up of PAD.  She is still having a fair bit of pain in her left leg. She is about a week status post revascularization for a second time on that left leg. What she describes sounds a lot like reperfusion syndrome with swelling, warmness, and pressure in her leg. She says she is still unable to walk. Her access site is well-healed. Her foot is reasonably warm.          Current Outpatient Prescriptions  Medication Sig Dispense Refill  . albuterol (VENTOLIN HFA) 108 (90 Base) MCG/ACT inhaler Inhale 2 puffs into the lungs every 6 (six) hours as needed for wheezing or shortness of breath. FOR SEASONAL ALLERGIES    . aspirin EC 81 MG tablet Take 1 tablet (81 mg total) by mouth daily. 150 tablet 2  . atorvastatin (LIPITOR) 20 MG tablet Take 1 tablet (20 mg total) by mouth daily. 30 tablet 5  . clopidogrel (PLAVIX) 75 MG tablet Take 1 tablet (75 mg total) by mouth daily. 30 tablet 11  . escitalopram (LEXAPRO) 20 MG tablet Take 20 mg by mouth at bedtime.    . ferrous sulfate 325 (65 FE) MG tablet Take 325 mg by mouth at bedtime.     . fexofenadine (ALLEGRA) 180 MG tablet Take 180 mg by mouth daily.    Marland Kitchen gabapentin (NEURONTIN) 100 MG capsule   4  . HYDROcodone-acetaminophen (NORCO/VICODIN) 5-325 MG tablet     . meclizine (ANTIVERT) 25 MG tablet Take 25 mg by mouth every 8 (eight) hours as needed for dizziness.     . metoprolol-hydrochlorothiazide (LOPRESSOR HCT) 50-25 MG tablet Take 1 tablet by mouth at bedtime.    . montelukast (SINGULAIR) 10 MG tablet Take 10 mg by mouth at bedtime.   3  . Propylene Glycol (SYSTANE BALANCE) 0.6 % SOLN Place 1-2 drops into both eyes 3 (three) times daily as needed (for itchiness or irritation).     . Vitamin D, Ergocalciferol, (DRISDOL) 50000 UNITS CAPS capsule Take 50,000 Units by mouth every 30 (thirty) days. Reported on 01/10/2016  0   No current facility-administered medications for this visit.         Past Medical History:  Diagnosis Date  . Arthritis   . Asthma   . Chronic kidney disease   . Depression   . Hypertension          Past Surgical History:  Procedure Laterality Date  . ABDOMINAL HYSTERECTOMY    . PERIPHERAL VASCULAR CATHETERIZATION Left 01/10/2016   Procedure: Lower Extremity Angiography; Surgeon: Algernon Huxley, MD; Location: [place] [facility]; Service: Cardiovascular; Laterality: Left;  . PERIPHERAL VASCULAR CATHETERIZATION  01/10/2016   Procedure: Lower Extremity Intervention; Surgeon: Algernon Huxley, MD; Location: [place] [facility]; Service: Cardiovascular;;  . PERIPHERAL VASCULAR CATHETERIZATION Right 01/17/2016   Procedure: Lower Extremity Angiography; Surgeon: Algernon Huxley, MD; Location: [facility]; Service: Cardiovascular; Laterality: Right;  . PERIPHERAL VASCULAR CATHETERIZATION  01/17/2016   Procedure: Lower Extremity Intervention; Surgeon: Algernon Huxley, MD; Location: [place]; Service: Cardiovascular;;  . THYROID SURGERY      Social History      Social History  Substance Use Topics  . Smoking status:  Current Every Day Smoker    Packs/day: 0.50  . Smokeless tobacco: Never Used  . Alcohol use No    Family History      Family History  Problem Relation Age of Onset  . CAD Mother   . Colon cancer Mother   . Heart disease Father          Allergies  Allergen Reactions  . Codeine Other (See Comments)    Dysuria  . Symbicort [Budesonide-Formoterol Fumarate] Other (See Comments)    Made patient jittery     REVIEW OF SYSTEMS(Negative unless checked)  Constitutional: '[]'$ Weight loss'[]'$ Fever'[]'$ Chills Cardiac:'[]'$ Chest pain'[]'$ Chest  pressure'[]'$ Palpitations '[]'$ Shortness of breath when laying flat '[]'$ Shortness of breath at rest '[]'$ Shortness of breath with exertion. Vascular: '[x]'$ Pain in legs with walking'[x]'$ Pain in legsat rest'[]'$ Pain in legs when laying flat '[x]'$ Claudication '[]'$ Pain in feet when walking '[]'$ Pain in feet at rest '[]'$ Pain in feet when laying flat '[]'$ History of DVT '[]'$ Phlebitis '[]'$ Swelling in legs '[]'$ Varicose veins '[]'$ Non-healing ulcers Pulmonary: '[]'$ Uses home oxygen '[]'$ Productive cough'[]'$ Hemoptysis '[]'$ Wheeze '[]'$ COPD '[]'$ Asthma Neurologic: '[]'$ Dizziness '[]'$ Blackouts '[]'$ Seizures '[]'$ History of stroke '[]'$ History of TIA'[]'$ Aphasia '[]'$ Temporary blindness'[]'$ Dysphagia '[]'$ Weaknessor numbness in arms '[x]'$ Weakness or numbnessin legs Musculoskeletal: '[]'$ Arthritis '[]'$ Joint swelling '[]'$ Joint pain '[]'$ Low back pain Hematologic:'[]'$ Easy bruising'[]'$ Easy bleeding '[]'$ Hypercoagulable state '[]'$ Anemic  Gastrointestinal:'[]'$ Blood in stool'[]'$ Vomiting blood'[]'$ Gastroesophageal reflux/heartburn'[]'$ Abdominal pain Genitourinary: '[]'$ Chronic kidney disease '[]'$ Difficulturination '[]'$ Frequenturination '[]'$ Burning with urination'[]'$ Hematuria Skin: '[]'$ Rashes '[]'$ Ulcers '[]'$ Wounds Psychological: '[]'$ History of anxiety'[]'$ History of major depression.  Physical Examination  BP 115/62 (BP Location: Right Arm)  Pulse 76  Resp 16  Ht '5\' 10"'$  (1.778 m)  Wt 180 lb (81.6 kg)  BMI 25.83 kg/m  Gen: WD/WN, NAD Head: [place]/AT, No temporalis wasting. Ear/Nose/Throat: Hearing grossly intact, nares w/o erythema or drainage, trachea midline Eyes: Conjunctiva clear. Sclera non-icteric Neck: Supple. No JVD.  Pulmonary: Good air movement, no use of accessory muscles.  Cardiac: RRR, normal S1, S2 Vascular:  Vessel Right Left  Radial Palpable Palpable  Ulnar Palpable Palpable  Brachial Palpable Palpable  Carotid Palpable, without bruit Palpable, without bruit  Aorta Not palpable N/A  Femoral Palpable  Palpable  Popliteal Not Palpable Not Palpable  PT 1+ Palpable Trace Palpable  DP Not Palpable 1+ Palpable   Gastrointestinal: soft, non-tender/non-distended. No guarding/reflex.  Musculoskeletal: M/S 5/5 throughout. No deformity or atrophy. 1+ left lower extremity swelling. Using a wheelchair today Neurologic: Sensation grossly intact in extremities. Symmetrical. Speech is fluent.  Psychiatric: Judgment intact, Mood & affect appropriate for pt's clinical situation. Dermatologic: No rashes or ulcers noted. No cellulitis or open wounds. Lymph : No Cervical, Axillary, or Inguinal lymphadenopathy.      Labs Recent Results (from the past 2160 hour(s))  Protime-INR     Status: None   Collection Time: 09/01/16  5:43 PM  Result Value Ref Range   Prothrombin Time 14.0 11.4 - 15.2 seconds   INR 1.07   APTT     Status: None   Collection Time: 09/01/16  5:43 PM  Result Value Ref Range   aPTT 31 24 - 36 seconds  CBC     Status: Abnormal   Collection Time: 09/01/16  5:43 PM  Result Value Ref Range   WBC 4.7 4.0 - 10.5 K/uL   RBC 3.50 (L) 3.87 - 5.11 MIL/uL   Hemoglobin 10.9 (L) 12.0 - 15.0 g/dL   HCT 33.4 (L) 36.0 - 46.0 %   MCV 95.4 78.0 - 100.0 fL   MCH 31.1 26.0 - 34.0 pg   MCHC 32.6 30.0 - 36.0 g/dL   RDW 13.1 11.5 - 15.5 %  Platelets 207 150 - 400 K/uL  Differential     Status: None   Collection Time: 09/01/16  5:43 PM  Result Value Ref Range   Neutrophils Relative % 68 %   Neutro Abs 3.2 1.7 - 7.7 K/uL   Lymphocytes Relative 22 %   Lymphs Abs 1.0 0.7 - 4.0 K/uL   Monocytes Relative 7 %   Monocytes Absolute 0.3 0.1 - 1.0 K/uL   Eosinophils Relative 3 %   Eosinophils Absolute 0.1 0.0 - 0.7 K/uL   Basophils Relative 0 %   Basophils Absolute 0.0 0.0 - 0.1 K/uL  Comprehensive metabolic panel     Status: Abnormal   Collection Time: 09/01/16  5:43 PM  Result Value Ref Range   Sodium 137 135 - 145 mmol/L   Potassium 3.9 3.5 - 5.1 mmol/L   Chloride 103 101 - 111 mmol/L     CO2 21 (L) 22 - 32 mmol/L   Glucose, Bld 94 65 - 99 mg/dL   BUN 22 (H) 6 - 20 mg/dL   Creatinine, Ser 1.26 (H) 0.44 - 1.00 mg/dL   Calcium 9.2 8.9 - 10.3 mg/dL   Total Protein 7.2 6.5 - 8.1 g/dL   Albumin 3.8 3.5 - 5.0 g/dL   AST 18 15 - 41 U/L   ALT 10 (L) 14 - 54 U/L   Alkaline Phosphatase 97 38 - 126 U/L   Total Bilirubin 0.6 0.3 - 1.2 mg/dL   GFR calc non Af Amer 39 (L) >60 mL/min   GFR calc Af Amer 45 (L) >60 mL/min    Comment: (NOTE) The eGFR has been calculated using the CKD EPI equation. This calculation has not been validated in all clinical situations. eGFR's persistently <60 mL/min signify possible Chronic Kidney Disease.    Anion gap 13 5 - 15  I-stat troponin, ED     Status: None   Collection Time: 09/01/16  5:54 PM  Result Value Ref Range   Troponin i, poc 0.00 0.00 - 0.08 ng/mL   Comment 3            Comment: Due to the release kinetics of cTnI, a negative result within the first hours of the onset of symptoms does not rule out myocardial infarction with certainty. If myocardial infarction is still suspected, repeat the test at appropriate intervals.   CBG monitoring, ED     Status: None   Collection Time: 09/01/16  5:55 PM  Result Value Ref Range   Glucose-Capillary 98 65 - 99 mg/dL  I-Stat Chem 8, ED     Status: Abnormal   Collection Time: 09/01/16  5:57 PM  Result Value Ref Range   Sodium 137 135 - 145 mmol/L   Potassium 4.2 3.5 - 5.1 mmol/L   Chloride 107 101 - 111 mmol/L   BUN 24 (H) 6 - 20 mg/dL   Creatinine, Ser 1.30 (H) 0.44 - 1.00 mg/dL   Glucose, Bld 92 65 - 99 mg/dL   Calcium, Ion 1.00 (L) 1.15 - 1.40 mmol/L   TCO2 21 0 - 100 mmol/L   Hemoglobin 11.6 (L) 12.0 - 15.0 g/dL   HCT 34.0 (L) 36.0 - 46.0 %  VAS [place] LOWER EXTREMITY ARTERIAL DUPLEX     Status: None   Collection Time: 09/02/16 12:42 PM  Result Value Ref Range   Left super femoral prox sys PSV 473 cm/s   Left super femoral mid sys PSV -24 cm/s   Left super femoral dist sys PSV  -28  cm/s   Left popliteal prox sys PSV 79 cm/s   Left popliteal dist sys PSV 70 cm/s   Left ant tibial sys PSV 63 cm/s   Right super femoral prox sys PSV 155 cm/s   Right super femoral mid sys PSV -64 cm/s   Right super femoral dist sys PSV -72 cm/s   Right popliteal prox sys PSV 70 cm/s   Right popliteal dist sys PSV 84 cm/s   Right post tibial sys PSV 88 cm/s   Left ant tibial mid sys 30 cm/s   Left ant tibial distal sys 28 cm/s   left post tibial dist sys 6 cm/s   left post tibial mid sys 34 cm/s   LEFT PERO DIST SYS 31.00 cm/s   LEFT PERO MID SYS 36 cm/s   RIGHT ANT MID TIBIAL SYS PSV -18 cm/s   RIGHT ANT DIST TIBAL SYS PSV -54 cm/s   RIGHT POST TIB DIST SYS 54 cm/s   RIGHT POST TIB MID SYS 77 cm/s   RIGHT PERO MID SYS 61 cm/s   LEFT SFA PROX DYS VEL 162 cm/s  BUN     Status: Abnormal   Collection Time: 10/04/16  9:23 AM  Result Value Ref Range   BUN 31 (H) 6 - 20 mg/dL  Creatinine, serum     Status: Abnormal   Collection Time: 10/04/16  9:23 AM  Result Value Ref Range   Creatinine, Ser 1.14 (H) 0.44 - 1.00 mg/dL   GFR calc non Af Amer 44 (L) >60 mL/min   GFR calc Af Amer 51 (L) >60 mL/min    Comment: (NOTE) The eGFR has been calculated using the CKD EPI equation. This calculation has not been validated in all clinical situations. eGFR's persistently <60 mL/min signify possible Chronic Kidney Disease.   Basic metabolic panel     Status: Abnormal   Collection Time: 10/05/16  4:36 AM  Result Value Ref Range   Sodium 139 135 - 145 mmol/L   Potassium 4.2 3.5 - 5.1 mmol/L   Chloride 112 (H) 101 - 111 mmol/L   CO2 21 (L) 22 - 32 mmol/L   Glucose, Bld 118 (H) 65 - 99 mg/dL   BUN 27 (H) 6 - 20 mg/dL   Creatinine, Ser 0.89 0.44 - 1.00 mg/dL   Calcium 8.3 (L) 8.9 - 10.3 mg/dL   GFR calc non Af Amer 60 (L) >60 mL/min   GFR calc Af Amer >60 >60 mL/min    Comment: (NOTE) The eGFR has been calculated using the CKD EPI equation. This calculation has not been validated in all  clinical situations. eGFR's persistently <60 mL/min signify possible Chronic Kidney Disease.    Anion gap 6 5 - 15  CBC     Status: Abnormal   Collection Time: 10/05/16  4:36 AM  Result Value Ref Range   WBC 4.7 3.6 - 11.0 K/uL   RBC 2.69 (L) 3.80 - 5.20 MIL/uL   Hemoglobin 8.7 (L) 12.0 - 16.0 g/dL   HCT 26.0 (L) 35.0 - 47.0 %   MCV 96.7 80.0 - 100.0 fL   MCH 32.5 26.0 - 34.0 pg   MCHC 33.6 32.0 - 36.0 g/dL   RDW 14.6 (H) 11.5 - 14.5 %   Platelets 126 (L) 150 - 440 K/uL    Radiology No results found.   Assessment/Plan  No problem-specific Assessment & Plan notes found for this encounter.    Leotis Pain, MD  10/17/2016 1:16 PM  This note was created with Dragon medical transcription system.  Any errors from dictation are purely unintentional

## 2016-10-17 NOTE — Assessment & Plan Note (Signed)
The patient is still having a fair bit of pain and ambulatory dysfunction. Some of this may be reperfusion syndrome. She does have significant peripheral arterial disease. I think getting her some outpatient physical therapy would be very helpful and if this can be done with a home health setting due to her nearly homebound status I think that would be a great thing. We will plan on rechecking her perfusion with noninvasive studies and seeing her back in about a month.

## 2016-10-19 ENCOUNTER — Encounter (INDEPENDENT_AMBULATORY_CARE_PROVIDER_SITE_OTHER): Payer: Medicare HMO

## 2016-10-19 ENCOUNTER — Ambulatory Visit: Payer: Medicare HMO | Admitting: Podiatry

## 2016-10-24 ENCOUNTER — Encounter (INDEPENDENT_AMBULATORY_CARE_PROVIDER_SITE_OTHER): Payer: Medicare HMO

## 2016-10-24 ENCOUNTER — Ambulatory Visit (INDEPENDENT_AMBULATORY_CARE_PROVIDER_SITE_OTHER): Payer: Medicare HMO | Admitting: Vascular Surgery

## 2016-10-27 ENCOUNTER — Ambulatory Visit: Payer: Medicare HMO

## 2016-10-27 ENCOUNTER — Ambulatory Visit: Payer: Medicare HMO | Admitting: Vascular Surgery

## 2016-11-02 DEATH — deceased

## 2016-12-04 ENCOUNTER — Encounter (INDEPENDENT_AMBULATORY_CARE_PROVIDER_SITE_OTHER): Payer: Medicare HMO

## 2016-12-04 ENCOUNTER — Ambulatory Visit (INDEPENDENT_AMBULATORY_CARE_PROVIDER_SITE_OTHER): Payer: Medicare HMO | Admitting: Vascular Surgery

## 2016-12-23 IMAGING — CR DG KNEE COMPLETE 4+V*L*
4 series · 4 of 4 positions shown · non-contrast
Comparison: None.

CLINICAL DATA: Fall onto knee.

EXAM:
LEFT KNEE - COMPLETE 4+ VIEW

[knee ap (1 of 3)]
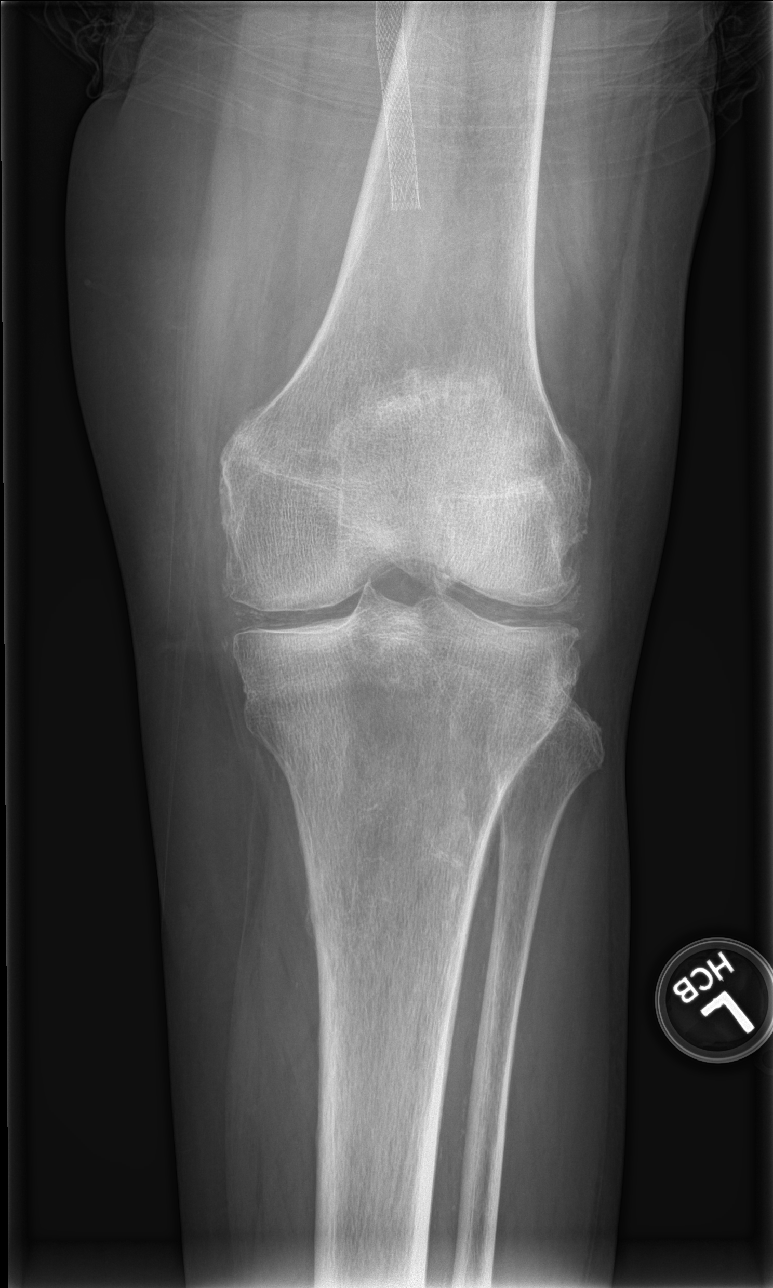

[knee lat]
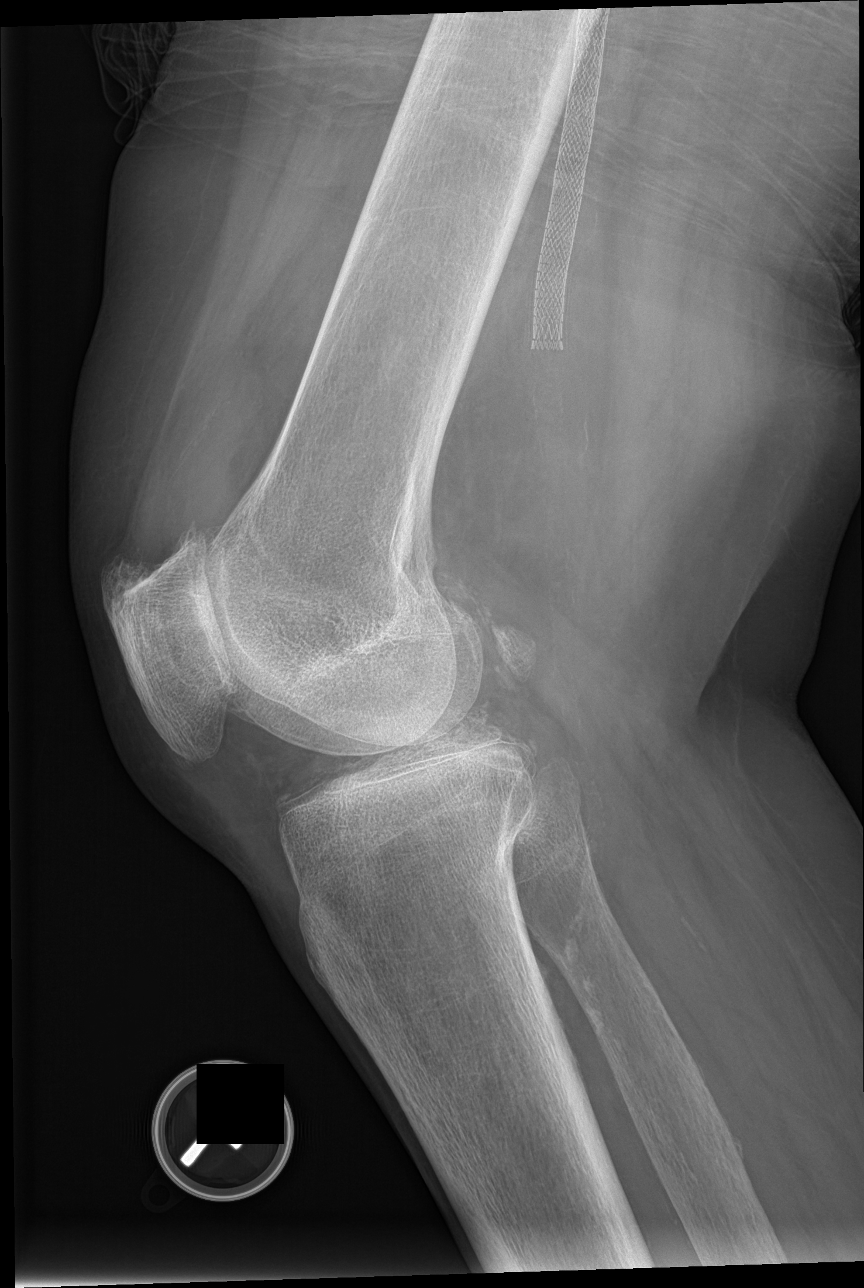

[knee ap (2 of 3)]
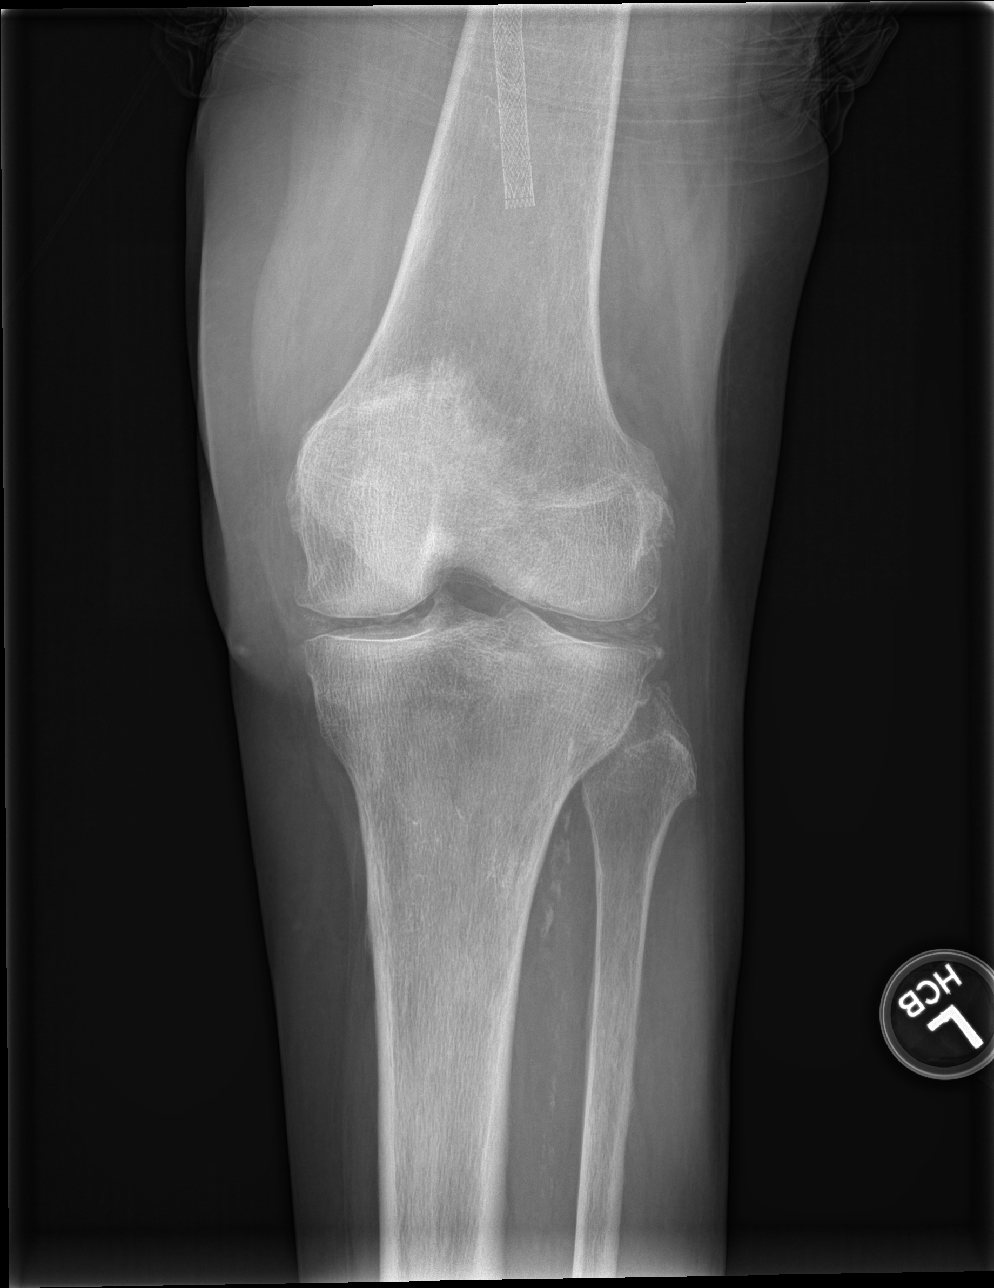

[knee ap (3 of 3)]
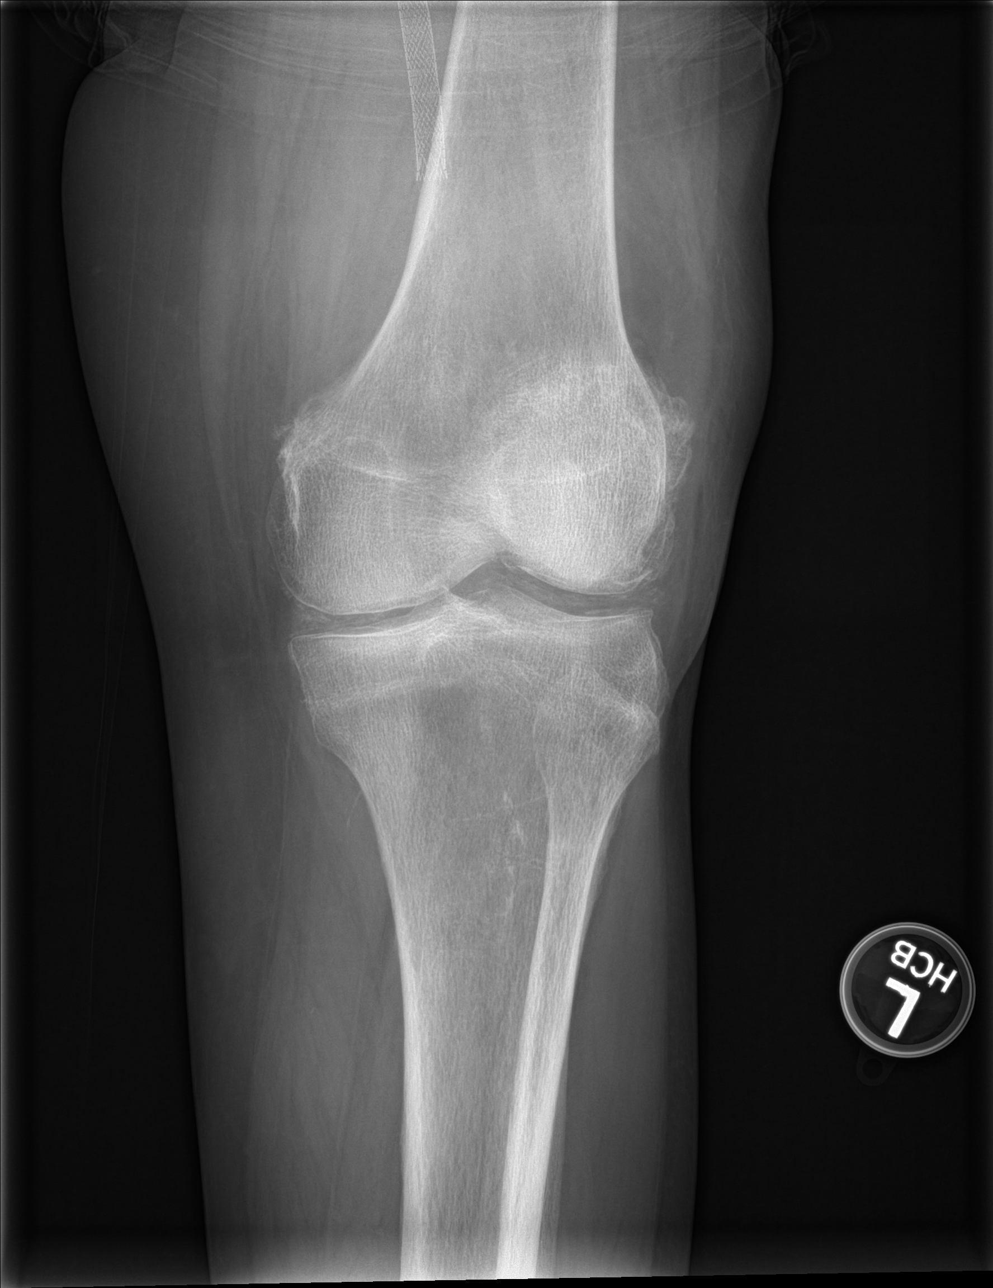

[4 of 4 positions shown; findings below may reference images not displayed]

FINDINGS: Small suprapatellar joint effusion. Moderate tricompartment
osteoarthritis is noted. There is chondrocalcinosis. No fracture or
subluxation.
IMPRESSION: 1. Moderate tricompartment osteoarthritis and chondrocalcinosis.
2. Small joint effusion.

## 2017-04-18 ENCOUNTER — Ambulatory Visit (INDEPENDENT_AMBULATORY_CARE_PROVIDER_SITE_OTHER): Payer: Medicare HMO | Admitting: Vascular Surgery

## 2017-04-18 ENCOUNTER — Encounter (INDEPENDENT_AMBULATORY_CARE_PROVIDER_SITE_OTHER): Payer: Medicare HMO
